# Patient Record
Sex: Female | Born: 1935 | Marital: Married | Smoking: Never smoker
Health system: Southern US, Community
[De-identification: ages and names within clinical notes are randomized; demographics above are authoritative.]

## PROBLEM LIST (undated history)

## (undated) DIAGNOSIS — I480 Paroxysmal atrial fibrillation: Secondary | ICD-10-CM

## (undated) DIAGNOSIS — E785 Hyperlipidemia, unspecified: Secondary | ICD-10-CM

## (undated) DIAGNOSIS — N186 End stage renal disease: Secondary | ICD-10-CM

## (undated) DIAGNOSIS — E1169 Type 2 diabetes mellitus with other specified complication: Secondary | ICD-10-CM

## (undated) DIAGNOSIS — E1121 Type 2 diabetes mellitus with diabetic nephropathy: Secondary | ICD-10-CM

## (undated) DIAGNOSIS — I509 Heart failure, unspecified: Secondary | ICD-10-CM

## (undated) DIAGNOSIS — I10 Essential (primary) hypertension: Secondary | ICD-10-CM

## (undated) DIAGNOSIS — I2510 Atherosclerotic heart disease of native coronary artery without angina pectoris: Secondary | ICD-10-CM

## (undated) HISTORY — PX: CORONARY ARTERY BYPASS GRAFT: SHX141

---

## 2000-10-24 HISTORY — PX: PACEMAKER PLACEMENT: SHX43

## 2006-12-21 ENCOUNTER — Emergency Department: Payer: Self-pay | Admitting: Emergency Medicine

## 2007-02-14 ENCOUNTER — Ambulatory Visit: Payer: Self-pay | Admitting: Ophthalmology

## 2007-02-21 ENCOUNTER — Ambulatory Visit: Payer: Self-pay | Admitting: Ophthalmology

## 2008-04-23 ENCOUNTER — Ambulatory Visit: Payer: Self-pay

## 2011-10-05 ENCOUNTER — Ambulatory Visit: Payer: Self-pay | Admitting: Ophthalmology

## 2012-08-01 ENCOUNTER — Ambulatory Visit: Payer: Self-pay | Admitting: Ophthalmology

## 2012-08-01 DIAGNOSIS — I10 Essential (primary) hypertension: Secondary | ICD-10-CM

## 2012-08-01 LAB — HEMOGLOBIN: HGB: 10.5 g/dL — ABNORMAL LOW (ref 12.0–16.0)

## 2012-08-08 ENCOUNTER — Ambulatory Visit: Payer: Self-pay | Admitting: Ophthalmology

## 2013-04-17 ENCOUNTER — Emergency Department: Payer: Self-pay | Admitting: Internal Medicine

## 2014-04-15 ENCOUNTER — Emergency Department: Payer: Self-pay | Admitting: Emergency Medicine

## 2015-01-19 IMAGING — CR RIGHT FOOT COMPLETE - 3+ VIEW
1 series · 3 of 3 positions shown · non-contrast
Comparison: 04/17/2013

CLINICAL DATA: Lateral right foot pain after a fall.

EXAM:
RIGHT FOOT COMPLETE - 3+ VIEW

[Series 1: x foot ap right · 0.14mm/px · 3 of 3 slices shown]
[im 1/3]
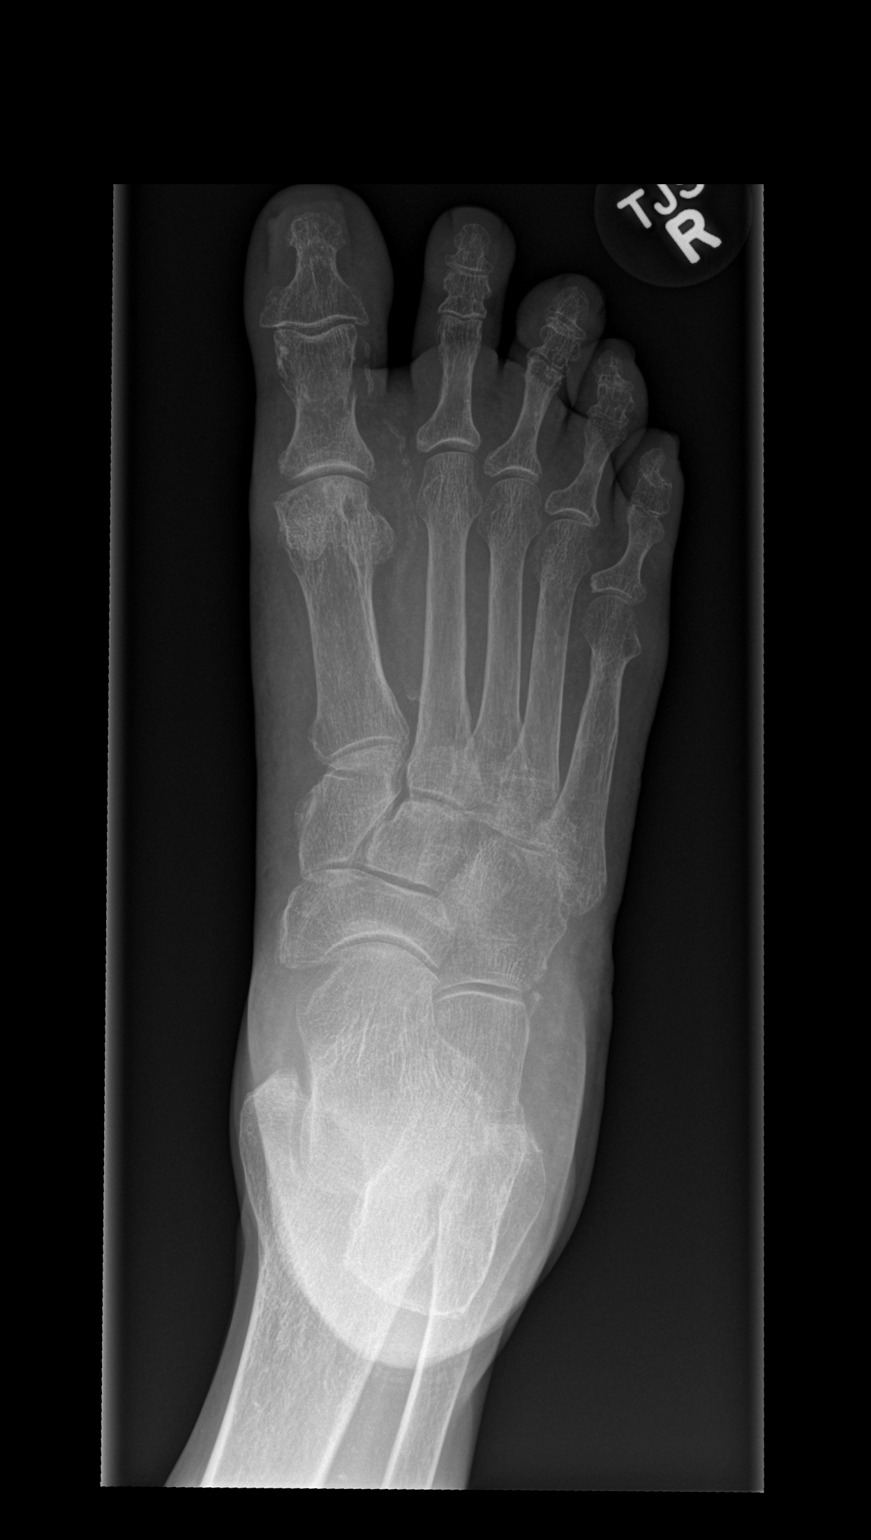
[im 2/3]
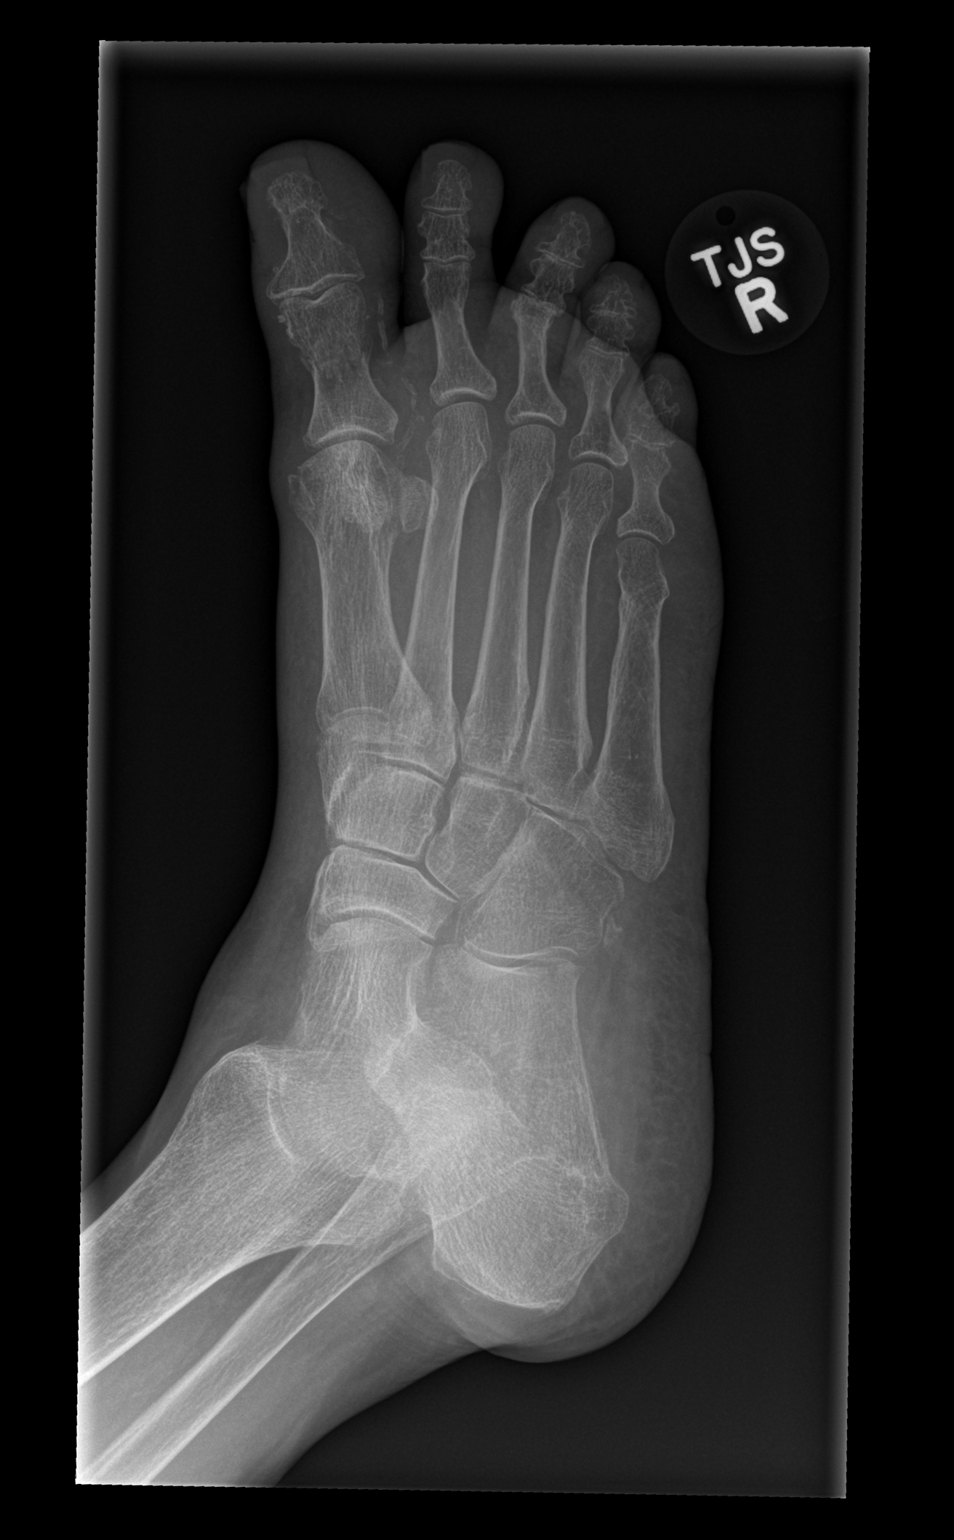
[im 3/3]
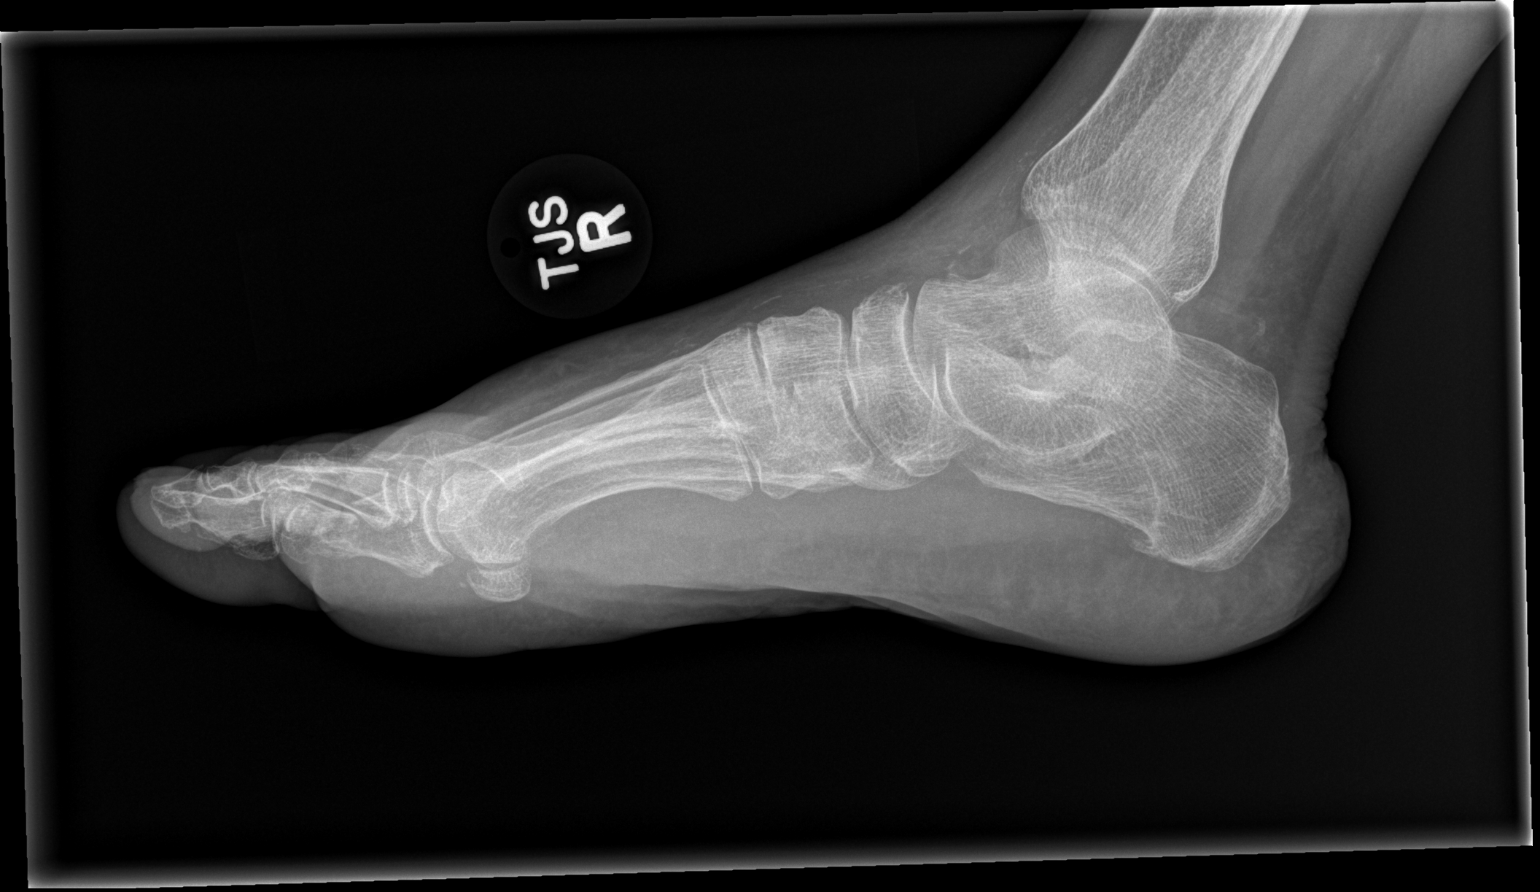

[3 of 3 positions shown; findings below may reference images not displayed]

FINDINGS: Diffuse bone demineralization. Mild degenerative changes in the
first metatarsal-phalangeal joint. There appears to be a transverse
fracture of the proximal shaft of the right first toe. No
significant displacement. No additional fractures are identified.
Old ununited ossicle over the cuboidal bone. Vascular
calcifications. Small plantar calcaneal spur.
IMPRESSION: Transverse nondisplaced fracture of the proximal phalanx of the
right first toe.

## 2015-02-10 NOTE — Op Note (Signed)
PATIENT NAME:  Jacqueline Ramos, Jacqueline Ramos MR#:  [id number] DATE OF BIRTH:  15-Feb-1935  DATE OF PROCEDURE:  08/08/2012  PREOPERATIVE DIAGNOSIS: Dense vitreous hemorrhage.   POSTOPERATIVE DIAGNOSES:  1. Dense vitreous hemorrhage.  2. Proliferative retinopathy.  3. Branch retinal vein occlusion.  PROCEDURES PERFORMED:  1. Pars plana vitrectomy of the right eye.  2. Endolaser of the right eye.   SURGEON: Garlan Fair, MD  ANESTHESIA: Retrobulbar block of the right eye with monitored anesthesia care.   COMPLICATIONS: None.   INDICATIONS FOR PROCEDURE: This patient presented to my office with significant loss of vision multiple weeks ago. Examination revealed a dense vitreous hemorrhage with no view of the back. Ultrasound noted a possible retinal detachment versus dense vitreous hemorrhage along the face of the vitreous. The risks, benefits, and alternatives of the above procedure were discussed and the patient wished to proceed.   DETAILS OF PROCEDURE: After informed consent was obtained, the patient was brought into the operative suite at [facility]. The patient was placed in the supine position and was given a small dose of Alfenta and a retrobulbar block was performed on the right eye by the primary surgeon without any complications. The right eye was prepped and draped in sterile manner. After lid speculum was inserted, a 23-gauge trocar was placed inferotemporally through displaced conjunctiva in an oblique fashion 3 mm beyond the limbus. Infusion cannula was turned on and inserted through the trocar and secured into position with Steri-Strips. Two more trocars were placed in a similar fashion superotemporally and superonasally. The vitreous cutter and light pipe were introduced in the eye and a core vitrectomy was performed. Care was taken to slowly remove the vitreous from anterior to posterior in order to avoid any possible incarceration or removal of retina. Once the  vitreous was clear and the retina could be visualized, it was noted that the retina was attached. Peripheral vitreous was trimmed for 360 degrees. Scleral depressed exam was performed in an area of neovascularization and the periphery was noted at approximately 8 o'clock. There appeared to be some venous occlusion associated with it. Scleral depressed exam was carried for 360 degrees and no signs of any breaks or tears could be identified. Peripheral vitreous was trimmed under scleral depression in order to remove as much blood as safely possible. Endolaser was introduced and panretinal photocoagulation was carried from the temporal arcade at approximately 6 o'clock around to 10:30 to 11 o'clock. Once this was completed, partial air-fluid exchange was performed and that area of laser was extended out to the ora serrata. The trocars were removed and the sclerotomies were closed using 6-0 plain gut. The wounds were noted to be airtight and pressure in the eye was confirmed to be approximately 15 mmHg. 5 mg of dexamethasone was given into the inferior fornix and the lid speculum was removed. The eye was cleaned and TobraDex was placed on the eye. A patch and shield were placed over the eye and the patient was taken to postanesthesia care with instructions to remain head up.  ____________________________ Teresa Pelton. Starling Manns, MD mfa:slb D: 08/08/2012 10:15:47 ET T: 08/08/2012 10:36:41 ET JOB#: [id number]  cc: Teresa Pelton. Starling Manns, MD, <Dictator> Coralee Rud MD ELECTRONICALLY SIGNED 08/21/2012 17:44

## 2018-11-03 ENCOUNTER — Other Ambulatory Visit: Payer: Self-pay

## 2018-11-03 ENCOUNTER — Emergency Department

## 2018-11-03 ENCOUNTER — Encounter: Payer: Self-pay | Admitting: Emergency Medicine

## 2018-11-03 ENCOUNTER — Inpatient Hospital Stay
Admit: 2018-11-03 | Discharge: 2018-11-03 | Disposition: A | Discharge status: Home / Self Care | Attending: Pulmonary Disease | Admitting: Pulmonary Disease

## 2018-11-03 ENCOUNTER — Inpatient Hospital Stay
Admission: EM | Admit: 2018-11-03 | Discharge: 2018-11-05 | DRG: 291 | Disposition: A | Discharge status: Home Health | Attending: Internal Medicine | Admitting: Internal Medicine

## 2018-11-03 DIAGNOSIS — Z7982 Long term (current) use of aspirin: Secondary | ICD-10-CM | POA: Diagnosis not present

## 2018-11-03 DIAGNOSIS — N186 End stage renal disease: Secondary | ICD-10-CM | POA: Diagnosis present

## 2018-11-03 DIAGNOSIS — I255 Ischemic cardiomyopathy: Secondary | ICD-10-CM | POA: Diagnosis present

## 2018-11-03 DIAGNOSIS — I2510 Atherosclerotic heart disease of native coronary artery without angina pectoris: Secondary | ICD-10-CM | POA: Diagnosis present

## 2018-11-03 DIAGNOSIS — J9601 Acute respiratory failure with hypoxia: Secondary | ICD-10-CM | POA: Diagnosis present

## 2018-11-03 DIAGNOSIS — Z79899 Other long term (current) drug therapy: Secondary | ICD-10-CM

## 2018-11-03 DIAGNOSIS — E785 Hyperlipidemia, unspecified: Secondary | ICD-10-CM | POA: Diagnosis present

## 2018-11-03 DIAGNOSIS — I480 Paroxysmal atrial fibrillation: Secondary | ICD-10-CM | POA: Diagnosis present

## 2018-11-03 DIAGNOSIS — Z794 Long term (current) use of insulin: Secondary | ICD-10-CM | POA: Diagnosis not present

## 2018-11-03 DIAGNOSIS — Z951 Presence of aortocoronary bypass graft: Secondary | ICD-10-CM

## 2018-11-03 DIAGNOSIS — Z79890 Hormone replacement therapy (postmenopausal): Secondary | ICD-10-CM

## 2018-11-03 DIAGNOSIS — Z9115 Patient's noncompliance with renal dialysis: Secondary | ICD-10-CM

## 2018-11-03 DIAGNOSIS — E1122 Type 2 diabetes mellitus with diabetic chronic kidney disease: Secondary | ICD-10-CM | POA: Diagnosis present

## 2018-11-03 DIAGNOSIS — Z950 Presence of cardiac pacemaker: Secondary | ICD-10-CM

## 2018-11-03 DIAGNOSIS — E875 Hyperkalemia: Secondary | ICD-10-CM | POA: Diagnosis present

## 2018-11-03 DIAGNOSIS — N184 Chronic kidney disease, stage 4 (severe): Secondary | ICD-10-CM

## 2018-11-03 DIAGNOSIS — I161 Hypertensive emergency: Secondary | ICD-10-CM | POA: Diagnosis present

## 2018-11-03 DIAGNOSIS — E872 Acidosis: Secondary | ICD-10-CM | POA: Diagnosis present

## 2018-11-03 DIAGNOSIS — G9340 Encephalopathy, unspecified: Secondary | ICD-10-CM

## 2018-11-03 DIAGNOSIS — I5023 Acute on chronic systolic (congestive) heart failure: Secondary | ICD-10-CM

## 2018-11-03 DIAGNOSIS — I132 Hypertensive heart and chronic kidney disease with heart failure and with stage 5 chronic kidney disease, or end stage renal disease: Secondary | ICD-10-CM | POA: Diagnosis present

## 2018-11-03 DIAGNOSIS — Z7902 Long term (current) use of antithrombotics/antiplatelets: Secondary | ICD-10-CM

## 2018-11-03 DIAGNOSIS — G931 Anoxic brain damage, not elsewhere classified: Secondary | ICD-10-CM | POA: Diagnosis present

## 2018-11-03 DIAGNOSIS — E039 Hypothyroidism, unspecified: Secondary | ICD-10-CM | POA: Diagnosis present

## 2018-11-03 HISTORY — DX: Atherosclerotic heart disease of native coronary artery without angina pectoris: I25.10

## 2018-11-03 HISTORY — DX: Heart failure, unspecified: I50.9

## 2018-11-03 HISTORY — DX: End stage renal disease: N18.6

## 2018-11-03 HISTORY — DX: Paroxysmal atrial fibrillation: I48.0

## 2018-11-03 HISTORY — DX: Type 2 diabetes mellitus with other specified complication: E11.69

## 2018-11-03 HISTORY — DX: Type 2 diabetes mellitus with diabetic nephropathy: E11.21

## 2018-11-03 HISTORY — DX: Essential (primary) hypertension: I10

## 2018-11-03 HISTORY — DX: Hyperlipidemia, unspecified: E78.5

## 2018-11-03 LAB — GLUCOSE, CAPILLARY
Glucose-Capillary: 190 mg/dL — ABNORMAL HIGH (ref 70–99)
Glucose-Capillary: 212 mg/dL — ABNORMAL HIGH (ref 70–99)
Glucose-Capillary: 63 mg/dL — ABNORMAL LOW (ref 70–99)
Glucose-Capillary: 128 mg/dL — ABNORMAL HIGH (ref 70–99)
Glucose-Capillary: 106 mg/dL — ABNORMAL HIGH (ref 70–99)
Glucose-Capillary: 118 mg/dL — ABNORMAL HIGH (ref 70–99)

## 2018-11-03 LAB — COMPREHENSIVE METABOLIC PANEL
ALBUMIN: 3.5 g/dL (ref 3.5–5.0)
ALT: 33 U/L (ref 0–44)
AST: 53 U/L — ABNORMAL HIGH (ref 15–41)
Alkaline Phosphatase: 123 U/L (ref 38–126)
Anion gap: 10 (ref 5–15)
BUN: 68 mg/dL — ABNORMAL HIGH (ref 8–23)
CO2: 16 mmol/L — ABNORMAL LOW (ref 22–32)
Calcium: 8.8 mg/dL — ABNORMAL LOW (ref 8.9–10.3)
Chloride: 114 mmol/L — ABNORMAL HIGH (ref 98–111)
Creatinine, Ser: 4.91 mg/dL — ABNORMAL HIGH (ref 0.44–1.00)
GFR calc Af Amer: 9 mL/min — ABNORMAL LOW (ref >60)
GFR calc non Af Amer: 8 mL/min — ABNORMAL LOW (ref >60)
GLUCOSE: 250 mg/dL — AB (ref 70–99)
POTASSIUM: 4.6 mmol/L (ref 3.5–5.1)
Sodium: 140 mmol/L (ref 135–145)
Total Bilirubin: 0.5 mg/dL (ref 0.3–1.2)
Total Protein: 8.3 g/dL — ABNORMAL HIGH (ref 6.5–8.1)

## 2018-11-03 LAB — CBC WITH DIFFERENTIAL/PLATELET
Abs Immature Granulocytes: 0.05 10*3/uL (ref 0.00–0.07)
BASOS ABS: 0 10*3/uL (ref 0.0–0.1)
Basophils Relative: 0 %
Eosinophils Absolute: 0.2 10*3/uL (ref 0.0–0.5)
Eosinophils Relative: 2 %
HCT: 32.6 % — ABNORMAL LOW (ref 36.0–46.0)
Hemoglobin: 10.1 g/dL — ABNORMAL LOW (ref 12.0–15.0)
Immature Granulocytes: 1 %
Lymphocytes Relative: 22 %
Lymphs Abs: 2.3 10*3/uL (ref 0.7–4.0)
MCH: 23.1 pg — ABNORMAL LOW (ref 26.0–34.0)
MCHC: 31 g/dL (ref 30.0–36.0)
MCV: 74.4 fL — ABNORMAL LOW (ref 80.0–100.0)
Monocytes Absolute: 0.7 10*3/uL (ref 0.1–1.0)
Monocytes Relative: 7 %
NRBC: 0 % (ref 0.0–0.2)
Neutro Abs: 6.9 10*3/uL (ref 1.7–7.7)
Neutrophils Relative %: 68 %
Platelets: 204 10*3/uL (ref 150–400)
RBC: 4.38 MIL/uL (ref 3.87–5.11)
RDW: 15.9 % — ABNORMAL HIGH (ref 11.5–15.5)
WBC: 10.1 10*3/uL (ref 4.0–10.5)

## 2018-11-03 LAB — POCT I-STAT, CHEM 8
BUN: 76 mg/dL — ABNORMAL HIGH (ref 8–23)
Calcium, Ion: 1.19 mmol/L (ref 1.15–1.40)
Chloride: 115 mmol/L — ABNORMAL HIGH (ref 98–111)
Creatinine, Ser: 5.5 mg/dL — ABNORMAL HIGH (ref 0.44–1.00)
GLUCOSE: 254 mg/dL — AB (ref 70–99)
HCT: 35 % — ABNORMAL LOW (ref 36.0–46.0)
Hemoglobin: 11.9 g/dL — ABNORMAL LOW (ref 12.0–15.0)
Potassium: 5.3 mmol/L — ABNORMAL HIGH (ref 3.5–5.1)
Sodium: 142 mmol/L (ref 135–145)
TCO2: 20 mmol/L — ABNORMAL LOW (ref 22–32)

## 2018-11-03 LAB — BLOOD GAS, ARTERIAL
Acid-base deficit: 8.4 mmol/L — ABNORMAL HIGH (ref 0.0–2.0)
Bicarbonate: 17.2 mmol/L — ABNORMAL LOW (ref 20.0–28.0)
FIO2: 100
MECHVT: 450 mL
Mechanical Rate: 15
O2 SAT: 99.4 %
PCO2 ART: 35 mmHg (ref 32.0–48.0)
PEEP: 5 cmH2O
PO2 ART: 168 mmHg — AB (ref 83.0–108.0)
Patient temperature: 37
pH, Arterial: 7.3 — ABNORMAL LOW (ref 7.350–7.450)

## 2018-11-03 LAB — ECHOCARDIOGRAM COMPLETE
Height: 54 in
Weight: 1777.79 oz

## 2018-11-03 LAB — BLOOD GAS, VENOUS
Acid-base deficit: 10.1 mmol/L — ABNORMAL HIGH (ref 0.0–2.0)
Bicarbonate: 17.2 mmol/L — ABNORMAL LOW (ref 20.0–28.0)
Delivery systems: POSITIVE
FIO2: 100
Mechanical Rate: 8
O2 Saturation: 80.1 %
PCO2 VEN: 42 mmHg — AB (ref 44.0–60.0)
Patient temperature: 37
pH, Ven: 7.22 — ABNORMAL LOW (ref 7.250–7.430)
pO2, Ven: 54 mmHg — ABNORMAL HIGH (ref 32.0–45.0)

## 2018-11-03 LAB — INFLUENZA PANEL BY PCR (TYPE A & B)
INFLBPCR: NEGATIVE
Influenza A By PCR: NEGATIVE

## 2018-11-03 LAB — TSH: TSH: 7.849 u[IU]/mL — ABNORMAL HIGH (ref 0.350–4.500)

## 2018-11-03 LAB — SALICYLATE LEVEL: Salicylate Lvl: <7 mg/dL (ref 2.8–30.0)

## 2018-11-03 LAB — BRAIN NATRIURETIC PEPTIDE: B Natriuretic Peptide: 2382 pg/mL — ABNORMAL HIGH (ref 0.0–100.0)

## 2018-11-03 LAB — TROPONIN I: Troponin I: <0.03 ng/mL (ref <0.03)

## 2018-11-03 LAB — MRSA PCR SCREENING: MRSA by PCR: NEGATIVE

## 2018-11-03 MED ORDER — FUROSEMIDE 10 MG/ML IJ SOLN: 120.0000 mg | Freq: Once | INTRAVENOUS | Status: DC

## 2018-11-03 MED ORDER — ONDANSETRON HCL 4 MG/2ML IJ SOLN
4.0000 mg | Freq: Four times a day (QID) | INTRAMUSCULAR | Status: DC | PRN
  Filled 2018-11-03: qty 2

## 2018-11-03 MED ORDER — ROCURONIUM BROMIDE 50 MG/5ML IV SOLN
60.0000 mg | Freq: Once | INTRAVENOUS | Status: AC
  Administered 2018-11-03: 60 mg via INTRAVENOUS

## 2018-11-03 MED ORDER — LEVOTHYROXINE SODIUM 112 MCG PO TABS
112.0000 ug | ORAL_TABLET | Freq: Every day | ORAL | Status: DC
  Administered 2018-11-03 – 2018-11-05 (×3): 112 ug
  Filled 2018-11-03 (×3): qty 1

## 2018-11-03 MED ORDER — ONDANSETRON HCL 4 MG PO TABS: 4.0000 mg | ORAL_TABLET | Freq: Four times a day (QID) | ORAL | Status: DC | PRN

## 2018-11-03 MED ORDER — DEXTROSE 50 % IV SOLN: 25.0000 g | Freq: Once | INTRAVENOUS | Status: AC

## 2018-11-03 MED ORDER — ACETAMINOPHEN 650 MG RE SUPP
650.0000 mg | Freq: Four times a day (QID) | RECTAL | Status: DC | PRN
  Administered 2018-11-04: 650 mg via RECTAL
  Filled 2018-11-03: qty 1

## 2018-11-03 MED ORDER — KETAMINE HCL 10 MG/ML IJ SOLN
100.0000 mg | Freq: Once | INTRAMUSCULAR | Status: AC
  Administered 2018-11-03: 100 mg via INTRAVENOUS

## 2018-11-03 MED ORDER — DOCUSATE SODIUM 100 MG PO CAPS
100.0000 mg | ORAL_CAPSULE | Freq: Two times a day (BID) | ORAL | Status: DC
  Administered 2018-11-04 – 2018-11-05 (×3): 100 mg via ORAL
  Filled 2018-11-03 (×3): qty 1

## 2018-11-03 MED ORDER — DEXTROSE 50 % IV SOLN
INTRAVENOUS | Status: AC
  Administered 2018-11-03: 50 mL
  Filled 2018-11-03: qty 50

## 2018-11-03 MED ORDER — INSULIN ASPART 100 UNIT/ML ~~LOC~~ SOLN
0.0000 [IU] | SUBCUTANEOUS | Status: DC
  Administered 2018-11-03: 3 [IU] via SUBCUTANEOUS
  Administered 2018-11-03: 2 [IU] via SUBCUTANEOUS
  Administered 2018-11-04: 1 [IU] via SUBCUTANEOUS
  Filled 2018-11-03 (×3): qty 1

## 2018-11-03 MED ORDER — IPRATROPIUM-ALBUTEROL 0.5-2.5 (3) MG/3ML IN SOLN
3.0000 mL | Freq: Four times a day (QID) | RESPIRATORY_TRACT | Status: DC
  Administered 2018-11-03: 3 mL via RESPIRATORY_TRACT
  Filled 2018-11-03 (×2): qty 3

## 2018-11-03 MED ORDER — HEPARIN SODIUM (PORCINE) 5000 UNIT/ML IJ SOLN
5000.0000 [IU] | Freq: Three times a day (TID) | INTRAMUSCULAR | Status: DC
  Administered 2018-11-03 – 2018-11-04 (×6): 5000 [IU] via SUBCUTANEOUS
  Filled 2018-11-03 (×7): qty 1

## 2018-11-03 MED ORDER — FENTANYL 2500MCG IN NS 250ML (10MCG/ML) PREMIX INFUSION
100.0000 ug/h | INTRAVENOUS | Status: DC
  Administered 2018-11-03: 100 ug/h via INTRAVENOUS
  Filled 2018-11-03 (×2): qty 250

## 2018-11-03 MED ORDER — ACETAMINOPHEN 325 MG PO TABS: 650.0000 mg | ORAL_TABLET | Freq: Four times a day (QID) | ORAL | Status: DC | PRN

## 2018-11-03 MED ORDER — FUROSEMIDE 10 MG/ML IJ SOLN
80.0000 mg | Freq: Once | INTRAMUSCULAR | Status: AC
  Administered 2018-11-03: 80 mg via INTRAVENOUS
  Filled 2018-11-03: qty 8

## 2018-11-03 MED ORDER — FUROSEMIDE 10 MG/ML IJ SOLN
40.0000 mg | Freq: Once | INTRAMUSCULAR | Status: AC
  Administered 2018-11-03: 40 mg via INTRAVENOUS
  Filled 2018-11-03: qty 4

## 2018-11-03 MED ORDER — FENTANYL CITRATE (PF) 100 MCG/2ML IJ SOLN
150.0000 ug | Freq: Once | INTRAMUSCULAR | Status: AC
  Administered 2018-11-03: 150 ug via INTRAVENOUS
  Filled 2018-11-03: qty 4

## 2018-11-03 MED ORDER — PROPOFOL 1000 MG/100ML IV EMUL
5.0000 ug/kg/min | INTRAVENOUS | Status: DC
  Administered 2018-11-03: 20 ug/kg/min via INTRAVENOUS
  Administered 2018-11-03: 50 ug/kg/min via INTRAVENOUS
  Filled 2018-11-03 (×2): qty 100

## 2018-11-03 MED ORDER — INSULIN ASPART 100 UNIT/ML ~~LOC~~ SOLN: 0.0000 [IU] | Freq: Every day | SUBCUTANEOUS | Status: DC

## 2018-11-03 MED ORDER — PANTOPRAZOLE SODIUM 40 MG IV SOLR
40.0000 mg | INTRAVENOUS | Status: DC
  Administered 2018-11-03 – 2018-11-04 (×2): 40 mg via INTRAVENOUS
  Filled 2018-11-03 (×2): qty 40

## 2018-11-03 NOTE — ED Provider Notes (Signed)
[facility] Emergency Department Provider Note  ____________________________________________   First MD Initiated Contact with Patient 11/03/18 0036     (approximate)  I have reviewed the triage vital signs and the nursing notes.   HISTORY  Chief Complaint Respiratory Distress  Level 5 exemption history is limited by the patient's critical illness  HPI Jacqueline Ramos is a 84 y.o. female comes to the emergency department via EMS with respiratory distress.  The patient is unable to provide much meaningful history secondary to severe respiratory distress.  Apparently her shortness of breath began acutely at 9 PM roughly 3-1/2 hours prior to arrival.  When EMS arrived they noted that she was saturating around 80% on room air although did come up with a facemask.  She has a known history of congestive heart failure, diabetes, hypertension, and chronic kidney disease although she does not require dialysis.    Past Medical History:  Diagnosis Date  . CHF (congestive heart failure) ([place])   . Diabetes mellitus without complication ([place])   . Hypertension   . Renal disorder     Patient Active Problem List   Diagnosis Date Noted  . Acute respiratory failure with hypoxemia ([place]) 11/03/2018    Past Surgical History:  Procedure Laterality Date  . CORONARY ARTERY BYPASS GRAFT    . PACEMAKER PLACEMENT  2002    Prior to Admission medications   Medication Sig Start Date End Date Taking? Authorizing Provider  amLODipine (NORVASC) 10 MG tablet Take 10 mg by mouth daily.   Yes [provider]  carvedilol (COREG) 12.5 MG tablet Take 25 mg by mouth 2 (two) times daily with a meal.   Yes [provider]  hydrALAZINE (APRESOLINE) 50 MG tablet Take 50 mg by mouth 3 (three) times daily.   Yes [provider]  insulin aspart (NOVOLOG) 100 UNIT/ML injection Inject 8 Units into the skin 3 (three) times daily before meals.   Yes [provider]  isosorbide mononitrate (IMDUR) 60 MG 24 hr tablet Take 60 mg by mouth daily.   Yes [provider]  levothyroxine (SYNTHROID, LEVOTHROID) 112 MCG tablet Take 112 mcg by mouth daily before breakfast.   Yes [provider]  lovastatin (MEVACOR) 40 MG tablet Take 40 mg by mouth at bedtime.   Yes [provider]  sevelamer carbonate (RENVELA) 800 MG tablet Take 800 mg by mouth 3 (three) times daily with meals.   Yes [provider]  torsemide (DEMADEX) 10 MG tablet Take 10 mg by mouth daily. May take an extra tablet if short of breath   Yes [provider]  vitamin B-12 (CYANOCOBALAMIN) 1000 MCG tablet Take 1,000 mcg by mouth daily.   Yes [provider]  nitroGLYCERIN (NITROSTAT) 0.4 MG SL tablet Place 0.4 mg under the tongue every 5 (five) minutes as needed for chest pain.    [provider]    Allergies Patient has no known allergies.  Family History  Problem Relation Age of Onset  . Obesity Daughter     Social History Social History   Tobacco Use  . Smoking status: Never Smoker  . Smokeless tobacco: Never Used  Substance Use Topics  . Alcohol use: Never    Frequency: Never  . Drug use: Never    Review of Systems Level 5 exemption history limited by the patient's clinical condition and respiratory distress  ____________________________________________   PHYSICAL EXAM:  VITAL SIGNS: ED Triage Vitals  Enc Vitals Group  BP --      Pulse --      Resp --      Temp --      Temp src --      SpO2 --      Weight 11/03/18 0035 110 lb 10.7 oz (50.2 kg)     Height 11/03/18 0035 4' 6"  (1.372 m)     Head Circumference --      Peak Flow --      Pain Score 11/03/18 0033 0     Pain Loc --      Pain Edu? --      Excl. in [place]? --     Constitutional: Appears critically ill with elevated respiratory rate speaking in short gasps and quite confused Eyes: PERRL EOMI. midrange and brisk Head: Atraumatic. Nose: No  congestion/rhinnorhea. Mouth/Throat: No trismus Neck: No stridor.  Unable to lie completely flat with a very large JVD Cardiovascular: Tachycardic rate, regular rhythm. Grossly normal heart sounds.  Good peripheral circulation. Respiratory: Severe respiratory distress with rales bilaterally through the majority of her lung fields.  Moving limited amounts of air Gastrointestinal: Soft nontender Musculoskeletal: Legs are equal in size with pitting Neurologic: Moves all 4 and feels all 4 Skin: Dripping sweat Psychiatric: Encephalopathic  ____________________________________________   DIFFERENTIAL includes but not limited to  Flash pulmonary edema, pneumothorax, reactive airway disease, pulmonary embolism, pneumonia ____________________________________________   LABS (all labs ordered are listed, but only abnormal results are displayed)  Labs Reviewed  BLOOD GAS, VENOUS - Abnormal; Notable for the following components:      Result Value   pH, Ven 7.22 (*)    pCO2, Ven 42 (*)    pO2, Ven 54.0 (*)    Bicarbonate 17.2 (*)    Acid-base deficit 10.1 (*)    All other components within normal limits  COMPREHENSIVE METABOLIC PANEL - Abnormal; Notable for the following components:   Chloride 114 (*)    CO2 16 (*)    Glucose, Bld 250 (*)    BUN 68 (*)    Creatinine, Ser 4.91 (*)    Calcium 8.8 (*)    Total Protein 8.3 (*)    AST 53 (*)    GFR calc non Af Amer 8 (*)    GFR calc Af Amer 9 (*)    All other components within normal limits  BRAIN NATRIURETIC PEPTIDE - Abnormal; Notable for the following components:   B Natriuretic Peptide 2,382.0 (*)    All other components within normal limits  CBC WITH DIFFERENTIAL/PLATELET - Abnormal; Notable for the following components:   Hemoglobin 10.1 (*)    HCT 32.6 (*)    MCV 74.4 (*)    MCH 23.1 (*)    RDW 15.9 (*)    All other components within normal limits  BLOOD GAS, ARTERIAL - Abnormal; Notable for the following components:   pH,  Arterial 7.30 (*)    pO2, Arterial 168 (*)    Bicarbonate 17.2 (*)    Acid-base deficit 8.4 (*)    All other components within normal limits  TSH - Abnormal; Notable for the following components:   TSH 7.849 (*)    All other components within normal limits  GLUCOSE, CAPILLARY - Abnormal; Notable for the following components:   Glucose-Capillary 190 (*)    All other components within normal limits  POCT I-STAT, CHEM 8 - Abnormal; Notable for the following components:   Potassium 5.3 (*)    Chloride 115 (*)  BUN 76 (*)    Creatinine, Ser 5.50 (*)    Glucose, Bld 254 (*)    TCO2 20 (*)    Hemoglobin 11.9 (*)    HCT 35.0 (*)    All other components within normal limits  MRSA PCR SCREENING  TROPONIN I  INFLUENZA PANEL BY PCR (TYPE A & B)  SALICYLATE LEVEL  I-STAT CHEM 8, ED    Lab work reviewed by me with a number of abnormalities.  Her thyroid functions are essentially normal.  Her first blood gas shows metabolic acidosis.  She is influenza negative.  Elevated BNP consistent with acute pulmonary edema __________________________________________  EKG  ED ECG REPORT I, Darel Hong, the attending physician, personally viewed and interpreted this ECG.  Date: 11/03/2018 EKG Time:  Rate: 78 Rhythm: normal sinus rhythm QRS Axis: Leftward axis Intervals: Prolonged QTC ST/T Wave abnormalities: Left bundle branch block with anticipated repolarization abnormalities Narrative Interpretation: no evidence of acute ischemia  ____________________________________________  RADIOLOGY  First chest x-ray reviewed by me with right-sided pleural effusion and significant pulmonary edema Second chest x-ray reviewed by me shows endotracheal tube and orogastric tubes in good position ____________________________________________   PROCEDURES  Procedure(s) performed: Yes  Angiocath insertion Performed by: Darel Hong  Consent: Verbal consent obtained. Risks and benefits: risks,  benefits and alternatives were discussed Time out: Immediately prior to procedure a "time out" was called to verify the correct patient, procedure, equipment, support staff and site/side marked as required.  Preparation: Patient was prepped and draped in the usual sterile fashion.  Vein Location: Left EJ  Gauge: 18  Normal blood return and flush without difficulty Patient tolerance: Patient tolerated the procedure well with no immediate complications.     .Critical Care Performed by: Darel Hong, MD Authorized by: Darel Hong, MD   Critical care provider statement:    Critical care time (minutes):  40   Critical care time was exclusive of:  Separately billable procedures and treating other patients   Critical care was necessary to treat or prevent imminent or life-threatening deterioration of the following conditions:  Respiratory failure   Critical care was time spent personally by me on the following activities:  Development of treatment plan with patient or surrogate, discussions with consultants, evaluation of patient's response to treatment, examination of patient, obtaining history from patient or surrogate, ordering and performing treatments and interventions, ordering and review of laboratory studies, ordering and review of radiographic studies, pulse oximetry, re-evaluation of patient's condition and review of old charts Procedure Name: Intubation Date/Time: 11/03/2018 2:44 AM Performed by: Darel Hong, MD Pre-anesthesia Checklist: Patient identified, Patient being monitored, Emergency Drugs available, Timeout performed and Suction available Oxygen Delivery Method: Nasal cannula Preoxygenation: Pre-oxygenation with 100% oxygen Induction Type: Rapid sequence Ventilation: Mask ventilation without difficulty Laryngoscope Size: Mac and 3 Grade View: Grade I Tube size: 7.5 mm Number of attempts: 1 Placement Confirmation: ETT inserted through vocal cords under direct  vision,  CO2 detector,  Breath sounds checked- equal and bilateral and Positive ETCO2 Secured at: 21 cm Tube secured with: ETT holder    OG placement Date/Time: 11/03/2018 2:44 AM Performed by: Darel Hong, MD Authorized by: Darel Hong, MD  Consent: The procedure was performed in an emergent situation. Patient tolerance: Patient tolerated the procedure well with no immediate complications     Critical Care performed: Yes  ____________________________________________   INITIAL IMPRESSION / ASSESSMENT AND PLAN / ED COURSE  Pertinent labs & imaging results that were available during my care  of the patient were reviewed by me and considered in my medical decision making (see chart for details).   As part of my medical decision making, I reviewed the following data within the [place] History obtained from family if available, nursing notes, old chart and ekg, as well as notes from prior ED visits.  The patient arrived in the emergency department critically ill-appearing in severe respiratory distress breathing more than 40 times a minute, diaphoretic, and quite confused.  Her legs are not particularly swollen although she is unable to lie flat with a very large JVD.  I am concerned about flash pulmonary edema and worsening renal failure versus heart failure.  We placed the patient on BiPAP initially and used rather aggressive settings with 15/5 and while this helped minimally she was still breathing heavily and remains lethargic and obtunded.  As altered mental status is a contraindication to BiPAP and the patient clearly needs positive pressure decision was made to intubate she was intubated without complication.  She is on propofol and fentanyl infusions for pain and sedation and I will give her 80 mg of IV Lasix to help begin aggressive diuresis.  We then discussed with the hospitalist Dr. Jannifer Franklin who has graciously agreed to admit the patient to his service.       ----------------------------------------- 2:44 AM on 11/03/2018 -----------------------------------------  The patient gets all of her care at [facility] and her family was at bedside and I updated them with her clinical condition and discussed the next steps.  They have requested a lateral transfer to [facility] if at all possible and I placed a call to [facility] however unfortunately they are at capacity and unable to accept any transfers.  The patient is admitted to our intensive care unit in improved but critical condition. ____________________________________________   FINAL CLINICAL IMPRESSION(S) / ED DIAGNOSES  Final diagnoses:  Acute respiratory failure with hypoxia (HCC)  Encephalopathy  Chronic kidney disease (CKD), stage IV (severe) (HCC)      NEW MEDICATIONS STARTED DURING THIS VISIT:  Current Discharge Medication List       Note:  This document was prepared using Dragon voice recognition software and may include unintentional dictation errors.    Darel Hong, MD 11/03/18 [phone]

## 2018-11-03 NOTE — ED Notes (Signed)
Family upset about pt being intubated without consent of family. This RN explained to pt's family that pt was having difficulty breathing. Family asked for MD Rifenbark to step in and speak with them. MD Rifenbark aware.

## 2018-11-03 NOTE — ED Triage Notes (Addendum)
Pt arrives via [facility] with c/o respiratory distress. Pt reports that after she finished eating around 2100 she experienced SOB. Per EMS, pt had VS 90% RA, BP 200/100, RR 30, and a right bundle branch block on their 12 lead. Pt is working at this time.

## 2018-11-03 NOTE — Progress Notes (Signed)
Patient successfully extubated to 2l [place] with no complications. Will continue to monitor.

## 2018-11-03 NOTE — Progress Notes (Addendum)
[facility] [place] Progress Note Patient Name: Jacqueline Ramos DOB: 03/19/1935 MRN: [id number]   Date of Service  11/03/2018  HPI/Events of Note  83/F with hx of CHF, brought in due to shortness of breath. Per ED, the patient was in respiratory distress and placed on BIPAP with only minimal improvement.  Pt was subsequently intubated.   Pt is intubate and sedated.  She is synchronous with the vent.   eICU Interventions  Diurese. Monitor I&Os.  Heparin for DVT prophylaxis.  PPI ordered. SBT in the morning.  Check 2d echo.      Intervention Category Evaluation Type: New Patient Evaluation  Elsie Lincoln 11/03/2018, 4:01 AM

## 2018-11-03 NOTE — ED Notes (Signed)
ED TO INPATIENT HANDOFF REPORT  Name/Age/Gender Jacqueline Ramos 84 y.o. female  Code Status   Home/SNF/Other Home  Chief Complaint Breathing difficulty  Level of Care/Admitting Diagnosis ED Disposition    ED Disposition Condition [place]: [place] [100120]  Level of Care: ICU [6]  Diagnosis: Acute respiratory failure with hypoxemia [facility]) [[id number]]  Admitting Physician: Harrie Foreman [[id number]]  Attending Physician: Harrie Foreman [[id number]]  Estimated length of stay: past midnight tomorrow  Certification:: I certify this patient will need inpatient services for at least 2 midnights  PT Class (Do Not Modify): Inpatient [101]  PT Acc Code (Do Not Modify): Private [1]       Medical History Past Medical History:  Diagnosis Date  . CHF (congestive heart failure) ([place])   . Diabetes mellitus without complication ([place])   . Hypertension   . Renal disorder     Allergies No Known Allergies  IV Location/Drains/Wounds Patient Lines/Drains/Airways Status   Active Line/Drains/Airways    Name:   Placement date:   Placement time:   Site:   Days:   Peripheral IV 11/03/18 Left Forearm   11/03/18    0045    Forearm   less than 1   Peripheral IV 11/03/18 Left External jugular   11/03/18    0054    External jugular   less than 1   Peripheral IV 11/03/18 Right Forearm   11/03/18    0115    Forearm   less than 1   NG/OG Tube Orogastric 16 Fr. Center mouth Xray   11/03/18    0115    Center mouth   less than 1   Urethral Catheter Madolyn Ackroyd RN Latex 14 Fr.   11/03/18    0127    Latex   less than 1   Airway 7.5 mm   11/03/18    0104     less than 1          Labs/Imaging Results for orders placed or performed during the hospital encounter of 11/03/18 (from the past 48 hour(s))  I-STAT, chem 8     Status: Abnormal   Collection Time: 11/03/18 12:38 AM  Result Value Ref Range   Sodium 142 135 - 145 mmol/L   Potassium 5.3 (H) 3.5 -  5.1 mmol/L   Chloride 115 (H) 98 - 111 mmol/L   BUN 76 (H) 8 - 23 mg/dL   Creatinine, Ser 5.50 (H) 0.44 - 1.00 mg/dL   Glucose, Bld 254 (H) 70 - 99 mg/dL   Calcium, Ion 1.19 1.15 - 1.40 mmol/L   TCO2 20 (L) 22 - 32 mmol/L   Hemoglobin 11.9 (L) 12.0 - 15.0 g/dL   HCT 35.0 (L) 36.0 - 46.0 %  Blood gas, venous     Status: Abnormal   Collection Time: 11/03/18 12:40 AM  Result Value Ref Range   FIO2 100.00    Delivery systems BILEVEL POSITIVE AIRWAY PRESSURE    pH, Ven 7.22 (L) 7.250 - 7.430    Comment: CRITICAL RESULT CALLED TO, READ BACK BY AND VERIFIED WITH: DR.RIFENBARK AT 0047 [id number] BY SMATHEW RRT    pCO2, Ven 42 (L) 44.0 - 60.0 mmHg   pO2, Ven 54.0 (H) 32.0 - 45.0 mmHg   Bicarbonate 17.2 (L) 20.0 - 28.0 mmol/L   Acid-base deficit 10.1 (H) 0.0 - 2.0 mmol/L   O2 Saturation 80.1 %   Patient temperature 37.0    Collection site VENOUS  Sample type VENOUS    Mechanical Rate 8     Comment: Performed at [facility], [address] [place], [place] [address]  Comprehensive metabolic panel     Status: Abnormal   Collection Time: 11/03/18 12:40 AM  Result Value Ref Range   Sodium 140 135 - 145 mmol/L   Potassium 4.6 3.5 - 5.1 mmol/L   Chloride 114 (H) 98 - 111 mmol/L   CO2 16 (L) 22 - 32 mmol/L   Glucose, Bld 250 (H) 70 - 99 mg/dL   BUN 68 (H) 8 - 23 mg/dL   Creatinine, Ser 4.91 (H) 0.44 - 1.00 mg/dL   Calcium 8.8 (L) 8.9 - 10.3 mg/dL   Total Protein 8.3 (H) 6.5 - 8.1 g/dL   Albumin 3.5 3.5 - 5.0 g/dL   AST 53 (H) 15 - 41 U/L   ALT 33 0 - 44 U/L   Alkaline Phosphatase 123 38 - 126 U/L   Total Bilirubin 0.5 0.3 - 1.2 mg/dL   GFR calc non Af Amer 8 (L) >60 mL/min   GFR calc Af Amer 9 (L) >60 mL/min   Anion gap 10 5 - 15    Comment: Performed at [facility], [address] [place], [place] [address]  Brain natriuretic peptide     Status: Abnormal   Collection Time: 11/03/18 12:40 AM  Result Value Ref Range   B Natriuretic Peptide 2,382.0 (H) 0.0 -  100.0 pg/mL    Comment: Performed at [facility], [address] [place], [place] [address]  Troponin I - Once     Status: None   Collection Time: 11/03/18 12:40 AM  Result Value Ref Range   Troponin I <0.03 <0.03 ng/mL    Comment: Performed at [facility], [address] [place], [place] [address]  CBC with Differential     Status: Abnormal   Collection Time: 11/03/18 12:40 AM  Result Value Ref Range   WBC 10.1 4.0 - 10.5 K/uL   RBC 4.38 3.87 - 5.11 MIL/uL   Hemoglobin 10.1 (L) 12.0 - 15.0 g/dL   HCT 32.6 (L) 36.0 - 46.0 %   MCV 74.4 (L) 80.0 - 100.0 fL   MCH 23.1 (L) 26.0 - 34.0 pg   MCHC 31.0 30.0 - 36.0 g/dL   RDW 15.9 (H) 11.5 - 15.5 %   Platelets 204 150 - 400 K/uL   nRBC 0.0 0.0 - 0.2 %   Neutrophils Relative % 68 %   Neutro Abs 6.9 1.7 - 7.7 K/uL   Lymphocytes Relative 22 %   Lymphs Abs 2.3 0.7 - 4.0 K/uL   Monocytes Relative 7 %   Monocytes Absolute 0.7 0.1 - 1.0 K/uL   Eosinophils Relative 2 %   Eosinophils Absolute 0.2 0.0 - 0.5 K/uL   Basophils Relative 0 %   Basophils Absolute 0.0 0.0 - 0.1 K/uL   Immature Granulocytes 1 %   Abs Immature Granulocytes 0.05 0.00 - 0.07 K/uL    Comment: Performed at [facility], [address] [place], [place] [address]  Influenza panel by PCR (type A & B)     Status: None   Collection Time: 11/03/18 12:40 AM  Result Value Ref Range   Influenza A By PCR NEGATIVE NEGATIVE   Influenza B By PCR NEGATIVE NEGATIVE    Comment: (NOTE) The Xpert Xpress Flu assay is intended as an aid in the diagnosis of  influenza and should not be used as a  sole basis for treatment.  This  assay is FDA approved for nasopharyngeal swab specimens only. Nasal  washings and aspirates are unacceptable for Xpert Xpress Flu testing. Performed at [facility], [address] [place], [place] [address]   Salicylate level     Status: None   Collection Time: 11/03/18  1:44 AM  Result Value Ref Range   Salicylate  Lvl <7.4 2.8 - 30.0 mg/dL    Comment: Performed at [facility], [address] [place], [place] [address]  Blood gas, arterial     Status: Abnormal   Collection Time: 11/03/18  2:24 AM  Result Value Ref Range   FIO2 100.00    Delivery systems VENTILATOR    Mode ASSIST CONTROL    VT 450 mL   Peep/cpap 5.0 cm H20   pH, Arterial 7.30 (L) 7.350 - 7.450   pCO2 arterial 35 32.0 - 48.0 mmHg   pO2, Arterial 168 (H) 83.0 - 108.0 mmHg   Bicarbonate 17.2 (L) 20.0 - 28.0 mmol/L   Acid-base deficit 8.4 (H) 0.0 - 2.0 mmol/L   O2 Saturation 99.4 %   Patient temperature 37.0    Collection site LEFT RADIAL    Sample type ARTERIAL DRAW    Allens test (pass/fail) PASS PASS   Mechanical Rate 15     Comment: Performed at [facility], [address] [place], [place] [address]   Dg Chest Port 1 View  Result Date: 11/03/2018 CLINICAL DATA:  Intubation EXAM: PORTABLE CHEST 1 VIEW COMPARISON:  11/03/2018 FINDINGS: An endotracheal tube is been placed with tip measuring 2.2 cm above the carina. Enteric tube placed, partially off the field of view but tip is in the left upper quadrant consistent with location in the upper stomach. Postoperative changes in the mediastinum. Cardiac pacemaker. Mild cardiac enlargement. Perihilar interstitial changes bilaterally suggesting edema. Superimposed atelectasis or consolidation in the right upper lung. Calcification of the aorta. No pleural effusions. No pneumothorax. IMPRESSION: Appliances appear in satisfactory location. Cardiac enlargement with perihilar edema. Superimposed atelectasis or consolidation in the right upper lung. Electronically Signed   By: Lucienne Capers M.D.   On: 11/03/2018 01:27   Dg Chest Port 1 View  Result Date: 11/03/2018 CLINICAL DATA:  84 y/o  F; respiratory distress. EXAM: PORTABLE CHEST 1 VIEW COMPARISON:  None. FINDINGS: Stable cardiomegaly given projection and technique. Aortic calcific atherosclerosis. Post median  sternotomy with wires in alignment. Two lead pacemaker. Reticular and peripheral linear opacities of the lungs. Small right effusion. No acute osseous abnormality is evident. IMPRESSION: 1. Interstitial pulmonary edema.  Small right effusion. 2. Cardiomegaly. 3.  Aortic Atherosclerosis (ICD10-I70.0). Electronically Signed   By: Kristine Garbe M.D.   On: 11/03/2018 01:06    Pending Labs [employer] (From admission, onward)    Start     Ordered   Signed and Held  TSH  Add-on,   R     Signed and Held          Vitals/Pain Today's Vitals   11/03/18 0144 11/03/18 0200 11/03/18 0230 11/03/18 0300  BP:  (!) 163/68 (!) 186/76 (!) 176/73  Pulse:  69 61 (!) 59  Resp:  10 17 17   Temp:  (!) 96.3 F (35.7 C) (!) 95.7 F (35.4 C) (!) 95.5 F (35.3 C)  TempSrc:      SpO2:  99% 100% 100%  Weight:      Height:      PainSc: Asleep  Isolation Precautions No active isolations  Medications Medications  propofol (DIPRIVAN) 1000 MG/100ML infusion (50 mcg/kg/min  50.2 kg Intravenous Bolus 11/03/18 0112)  fentaNYL 253mcg in NS 266mL (61mcg/ml) infusion-PREMIX (100 mcg/hr Intravenous New Bag/Given 11/03/18 0110)  fentaNYL (SUBLIMAZE) injection 150 mcg (150 mcg Intravenous Given 11/03/18 0100)  rocuronium (ZEMURON) injection 60 mg (60 mg Intravenous Given 11/03/18 0100)  ketamine (KETALAR) injection 100 mg (100 mg Intravenous Given 11/03/18 0100)  furosemide (LASIX) injection 80 mg (80 mg Intravenous Given 11/03/18 0115)    Mobility walks

## 2018-11-03 NOTE — H&P (Signed)
Jacqueline Ramos is an 84 y.o. female.   Chief Complaint: Shortness of breath HPI: The patient with past medical history of CHF, hypertension, diabetes, CAD status post CABG and pacemaker placement presents to the emergency department via EMS after acute onset of shortness of breath.  The patient reported that her dyspnea began after dinner.  She denied chest pain at the time.  Chest x-ray showed pulmonary edema and the patient was given Lasix IV.  She was started on BiPAP for increased work of breathing but began to fatigue and was intubated for respiratory failure.  Once stabilized emergency department staff call hospitalist service for admission.  Past Medical History:  Diagnosis Date  . CHF (congestive heart failure) ([place])   . Diabetes mellitus without complication ([place])   . Hypertension   . Renal disorder     Past Surgical History:  Procedure Laterality Date  . CORONARY ARTERY BYPASS GRAFT    . PACEMAKER PLACEMENT  2002    Family History  Problem Relation Age of Onset  . Obesity Daughter    Social History:  reports that she has never smoked. She has never used smokeless tobacco. She reports that she does not drink alcohol or use drugs.  Allergies: No Known Allergies  Medications Prior to Admission  Medication Sig Dispense Refill  . amLODipine (NORVASC) 10 MG tablet Take 10 mg by mouth daily.    . carvedilol (COREG) 12.5 MG tablet Take 25 mg by mouth 2 (two) times daily with a meal.    . hydrALAZINE (APRESOLINE) 50 MG tablet Take 50 mg by mouth 3 (three) times daily.    . insulin aspart (NOVOLOG) 100 UNIT/ML injection Inject 8 Units into the skin 3 (three) times daily before meals.    . isosorbide mononitrate (IMDUR) 60 MG 24 hr tablet Take 60 mg by mouth daily.    Marland Kitchen levothyroxine (SYNTHROID, LEVOTHROID) 112 MCG tablet Take 112 mcg by mouth daily before breakfast.    . lovastatin (MEVACOR) 40 MG tablet Take 40 mg by mouth at bedtime.    . sevelamer carbonate (RENVELA) 800 MG  tablet Take 800 mg by mouth 3 (three) times daily with meals.    . torsemide (DEMADEX) 10 MG tablet Take 10 mg by mouth daily. May take an extra tablet if short of breath    . vitamin B-12 (CYANOCOBALAMIN) 1000 MCG tablet Take 1,000 mcg by mouth daily.    . nitroGLYCERIN (NITROSTAT) 0.4 MG SL tablet Place 0.4 mg under the tongue every 5 (five) minutes as needed for chest pain.      Results for orders placed or performed during the hospital encounter of 11/03/18 (from the past 48 hour(s))  I-STAT, chem 8     Status: Abnormal   Collection Time: 11/03/18 12:38 AM  Result Value Ref Range   Sodium 142 135 - 145 mmol/L   Potassium 5.3 (H) 3.5 - 5.1 mmol/L   Chloride 115 (H) 98 - 111 mmol/L   BUN 76 (H) 8 - 23 mg/dL   Creatinine, Ser 5.50 (H) 0.44 - 1.00 mg/dL   Glucose, Bld 254 (H) 70 - 99 mg/dL   Calcium, Ion 1.19 1.15 - 1.40 mmol/L   TCO2 20 (L) 22 - 32 mmol/L   Hemoglobin 11.9 (L) 12.0 - 15.0 g/dL   HCT 35.0 (L) 36.0 - 46.0 %  Blood gas, venous     Status: Abnormal   Collection Time: 11/03/18 12:40 AM  Result Value Ref Range   FIO2 100.00  Delivery systems BILEVEL POSITIVE AIRWAY PRESSURE    pH, Ven 7.22 (L) 7.250 - 7.430    Comment: CRITICAL RESULT CALLED TO, READ BACK BY AND VERIFIED WITH: DR.RIFENBARK AT 0047 [id number] BY SMATHEW RRT    pCO2, Ven 42 (L) 44.0 - 60.0 mmHg   pO2, Ven 54.0 (H) 32.0 - 45.0 mmHg   Bicarbonate 17.2 (L) 20.0 - 28.0 mmol/L   Acid-base deficit 10.1 (H) 0.0 - 2.0 mmol/L   O2 Saturation 80.1 %   Patient temperature 37.0    Collection site VENOUS    Sample type VENOUS    Mechanical Rate 8     Comment: Performed at [facility], [address] [place], [place] [address]  Comprehensive metabolic panel     Status: Abnormal   Collection Time: 11/03/18 12:40 AM  Result Value Ref Range   Sodium 140 135 - 145 mmol/L   Potassium 4.6 3.5 - 5.1 mmol/L   Chloride 114 (H) 98 - 111 mmol/L   CO2 16 (L) 22 - 32 mmol/L   Glucose, Bld 250 (H) 70 - 99  mg/dL   BUN 68 (H) 8 - 23 mg/dL   Creatinine, Ser 4.91 (H) 0.44 - 1.00 mg/dL   Calcium 8.8 (L) 8.9 - 10.3 mg/dL   Total Protein 8.3 (H) 6.5 - 8.1 g/dL   Albumin 3.5 3.5 - 5.0 g/dL   AST 53 (H) 15 - 41 U/L   ALT 33 0 - 44 U/L   Alkaline Phosphatase 123 38 - 126 U/L   Total Bilirubin 0.5 0.3 - 1.2 mg/dL   GFR calc non Af Amer 8 (L) >60 mL/min   GFR calc Af Amer 9 (L) >60 mL/min   Anion gap 10 5 - 15    Comment: Performed at [facility], [address] [place], [place] [address]  Brain natriuretic peptide     Status: Abnormal   Collection Time: 11/03/18 12:40 AM  Result Value Ref Range   B Natriuretic Peptide 2,382.0 (H) 0.0 - 100.0 pg/mL    Comment: Performed at [facility], [address] [place], [place] [address]  Troponin I - Once     Status: None   Collection Time: 11/03/18 12:40 AM  Result Value Ref Range   Troponin I <0.03 <0.03 ng/mL    Comment: Performed at [facility], [address] [place], [place] [address]  CBC with Differential     Status: Abnormal   Collection Time: 11/03/18 12:40 AM  Result Value Ref Range   WBC 10.1 4.0 - 10.5 K/uL   RBC 4.38 3.87 - 5.11 MIL/uL   Hemoglobin 10.1 (L) 12.0 - 15.0 g/dL   HCT 32.6 (L) 36.0 - 46.0 %   MCV 74.4 (L) 80.0 - 100.0 fL   MCH 23.1 (L) 26.0 - 34.0 pg   MCHC 31.0 30.0 - 36.0 g/dL   RDW 15.9 (H) 11.5 - 15.5 %   Platelets 204 150 - 400 K/uL   nRBC 0.0 0.0 - 0.2 %   Neutrophils Relative % 68 %   Neutro Abs 6.9 1.7 - 7.7 K/uL   Lymphocytes Relative 22 %   Lymphs Abs 2.3 0.7 - 4.0 K/uL   Monocytes Relative 7 %   Monocytes Absolute 0.7 0.1 - 1.0 K/uL   Eosinophils Relative 2 %   Eosinophils Absolute 0.2 0.0 - 0.5 K/uL   Basophils Relative 0 %   Basophils Absolute 0.0 0.0 - 0.1 K/uL   Immature Granulocytes 1 %  Abs Immature Granulocytes 0.05 0.00 - 0.07 K/uL    Comment: Performed at [facility] [address] [place], [place] [address]  Influenza panel by PCR (type A & B)      Status: None   Collection Time: 11/03/18 12:40 AM  Result Value Ref Range   Influenza A By PCR NEGATIVE NEGATIVE   Influenza B By PCR NEGATIVE NEGATIVE    Comment: (NOTE) The Xpert Xpress Flu assay is intended as an aid in the diagnosis of  influenza and should not be used as a sole basis for treatment.  This  assay is FDA approved for nasopharyngeal swab specimens only. Nasal  washings and aspirates are unacceptable for Xpert Xpress Flu testing. Performed at [facility], [address] [place], [place] [address]   TSH     Status: Abnormal   Collection Time: 11/03/18 12:40 AM  Result Value Ref Range   TSH 7.849 (H) 0.350 - 4.500 uIU/mL    Comment: Performed by a 3rd Generation assay with a functional sensitivity of <=0.01 uIU/mL. Performed at [facility], [address] [place], [place] [address]   Salicylate level     Status: None   Collection Time: 11/03/18  1:44 AM  Result Value Ref Range   Salicylate Lvl <3.7 2.8 - 30.0 mg/dL    Comment: Performed at [facility], [address] [place], [place] [address]  Blood gas, arterial     Status: Abnormal   Collection Time: 11/03/18  2:24 AM  Result Value Ref Range   FIO2 100.00    Delivery systems VENTILATOR    Mode ASSIST CONTROL    VT 450 mL   Peep/cpap 5.0 cm H20   pH, Arterial 7.30 (L) 7.350 - 7.450   pCO2 arterial 35 32.0 - 48.0 mmHg   pO2, Arterial 168 (H) 83.0 - 108.0 mmHg   Bicarbonate 17.2 (L) 20.0 - 28.0 mmol/L   Acid-base deficit 8.4 (H) 0.0 - 2.0 mmol/L   O2 Saturation 99.4 %   Patient temperature 37.0    Collection site LEFT RADIAL    Sample type ARTERIAL DRAW    Allens test (pass/fail) PASS PASS   Mechanical Rate 15     Comment: Performed at [facility], [address] [place], [place] [address]  MRSA PCR Screening     Status: None   Collection Time: 11/03/18  4:03 AM  Result Value Ref Range   MRSA by PCR NEGATIVE NEGATIVE    Comment:        The GeneXpert  MRSA Assay (FDA approved for NASAL specimens only), is one component of a comprehensive MRSA colonization surveillance program. It is not intended to diagnose MRSA infection nor to guide or monitor treatment for MRSA infections. Performed at [facility], [address] [place], [place] [address]    Dg Chest Port 1 View  Result Date: 11/03/2018 CLINICAL DATA:  Intubation EXAM: PORTABLE CHEST 1 VIEW COMPARISON:  11/03/2018 FINDINGS: An endotracheal tube is been placed with tip measuring 2.2 cm above the carina. Enteric tube placed, partially off the field of view but tip is in the left upper quadrant consistent with location in the upper stomach. Postoperative changes in the mediastinum. Cardiac pacemaker. Mild cardiac enlargement. Perihilar interstitial changes bilaterally suggesting edema. Superimposed atelectasis or consolidation in the right upper lung. Calcification of the aorta. No pleural effusions. No pneumothorax. IMPRESSION: Appliances appear in satisfactory location. Cardiac enlargement with perihilar edema. Superimposed atelectasis or consolidation in  the right upper lung. Electronically Signed   By: Lucienne Capers M.D.   On: 11/03/2018 01:27   Dg Chest Port 1 View  Result Date: 11/03/2018 CLINICAL DATA:  84 y/o  F; respiratory distress. EXAM: PORTABLE CHEST 1 VIEW COMPARISON:  None. FINDINGS: Stable cardiomegaly given projection and technique. Aortic calcific atherosclerosis. Post median sternotomy with wires in alignment. Two lead pacemaker. Reticular and peripheral linear opacities of the lungs. Small right effusion. No acute osseous abnormality is evident. IMPRESSION: 1. Interstitial pulmonary edema.  Small right effusion. 2. Cardiomegaly. 3.  Aortic Atherosclerosis (ICD10-I70.0). Electronically Signed   By: Kristine Garbe M.D.   On: 11/03/2018 01:06    Review of Systems  Unable to perform ROS: Intubated    Blood pressure (!) 118/59, pulse 60,  temperature (!) 94.8 F (34.9 C), temperature source Bladder, resp. rate 15, height 4\' 6"  (1.372 m), weight 50.4 kg, SpO2 100 %. Physical Exam  Vitals reviewed. Constitutional: She is oriented to person, place, and time. She appears well-developed and well-nourished. No distress. She is intubated.  HENT:  Head: Normocephalic and atraumatic.  Mouth/Throat: Oropharynx is clear and moist.  Eyes: Pupils are equal, round, and reactive to light. Conjunctivae and EOM are normal. No scleral icterus.  Neck: Normal range of motion. Neck supple. No JVD present. No tracheal deviation present. No thyromegaly present.  Cardiovascular: Normal rate, regular rhythm and normal heart sounds. Exam reveals no gallop and no friction rub.  No murmur heard. Respiratory: Effort normal and breath sounds normal. She is intubated.  GI: Soft. Bowel sounds are normal. She exhibits no distension. There is no abdominal tenderness.  Genitourinary:    Genitourinary Comments: Deferred   Musculoskeletal: Normal range of motion.        General: No edema.  Lymphadenopathy:    She has no cervical adenopathy.  Neurological: She is alert and oriented to person, place, and time. No cranial nerve deficit. She exhibits normal muscle tone.  Skin: Skin is warm and dry. No rash noted. No erythema.  Psychiatric:  Intubated and sedated     Assessment/Plan This is an 84 year old female admitted for CHF exacerbation. 1.  CHF: Acute on chronic; systolic.  Last EF 35 to 40% measured in March 2019.  Continue diuresis.  Heart rate optimized.  Monitor telemetry.  Consider repeat echocardiogram. 2.  Respiratory failure: Acute; with hypoxemia.  May be a component of pneumonia as there is consolidation superimposed upon perihilar edema seen on chest x-ray.  The patient is also mildly hypothermic although this may be environmental.  Monitor for persistent signs or symptoms of sepsis.  Wean supplemental O2 as tolerated.  Continue mechanical  ventilation. 3.  Hypertension: Controlled now.  Continue beta-blocker.  Resume ACE inhibitor once patient is extubated and taking p.o.  Consider discontinuation of amlodipine or substitute hydralazine. 4.  CAD: Stable; continue Imdur 5.  Diabetes mellitus type 2: Sliding scale insulin while hospitalized.  Check hemoglobin A1c. 6.  Hypothyroidism: Check TSH; continue Synthroid 7.  CKD: ESRD not on dialysis.  Continue Renvela.  Strict I's and O's. 8.  DVT prophylaxis: Heparin 9.  GI prophylaxis: Pantoprazole once intubated 24 hours. The patient is a full code.  I have personally spent 45 minutes in critical care time with this patient.  Harrie Foreman, MD 11/03/2018, 6:56 AM

## 2018-11-03 NOTE — Consult Note (Signed)
PULMONARY / CRITICAL CARE MEDICINE  Name: Jacqueline Ramos MRN: [id number] DOB: [birth year]    LOS: 0  Referring Provider:  Dr Marcille Blanco Reason for Referral:  Acute dyspnea and respiratory failure Brief patient description: 84 year old female admitted with acute respiratory failure secondary to acute pulmonary edema and acute CHF exacerbation. HPI: This is an 84 year old African-American female with a past medical history as indicated below, end-stage renal disease not on dialysis, who presented to the ED with acute dyspnea.  History is obtained from ED records and from patient's daughter as patient is currently intubated and sedated.  Per ED records, patient indicated that after she finished eating dinner, she started experiencing shortness of breath.  When EMS arrived, her SPO2 was 90% on room air and her blood pressure was 200/100 with a respiratory rate of 30.  Upon arrival in the ED, her SPO2 was 80% on room air.  She was placed on BiPAP but remains severely tachypneic and hypoxic hence she was intubated.  Her chest x-ray showed pulmonary edema with slight consolidation in the right upper lobe and her labs showed a potassium of 5.3, glucose of 254, creatinine of 5.50, proBNP of 2382, and a TSH of 7.849.  Rapid influenza swab was negative.  She is being admitted to the ICU for further management  Past Medical History:  Diagnosis Date  . CHF (congestive heart failure) ([place])   . Diabetes mellitus without complication ([place])   . Hypertension   . Renal disorder    Past Surgical History:  Procedure Laterality Date  . CORONARY ARTERY BYPASS GRAFT    . PACEMAKER PLACEMENT  2002   Prior to Admission medications   Medication Sig Start Date End Date Taking? Authorizing Provider  amLODipine (NORVASC) 5 MG tablet Take 5 mg by mouth daily.   Yes [provider]  clopidogrel (PLAVIX) 75 MG tablet Take 75 mg by mouth daily.   Yes [provider]  donepezil (ARICEPT) 5 MG tablet Take 1  tablet (5 mg total) by mouth at bedtime. 06/18/18 07/28/18 Yes Sowles, Drue Stager, MD  empagliflozin (JARDIANCE) 25 MG TABS tablet Take 25 mg by mouth daily.   Yes [provider]  glycopyrrolate (ROBINUL) 1 MG tablet Take 1 mg by mouth 2 (two) times daily.   Yes [provider]  insulin aspart (NOVOLOG FLEXPEN) 100 UNIT/ML FlexPen Inject 12 Units into the skin 2 (two) times daily.   Yes [provider]  insulin aspart (NOVOLOG) 100 UNIT/ML FlexPen Inject 18 Units into the skin daily. At 1700   Yes [provider]  Insulin Degludec-Liraglutide (XULTOPHY) 100-3.6 UNIT-MG/ML SOPN Inject 50 Units into the skin daily.   Yes [provider]  levETIRAcetam (KEPPRA) 500 MG tablet Take 500 mg by mouth 2 (two) times daily.   Yes [provider]  lipase/protease/amylase (CREON) 12000 units CPEP capsule Take 6,000 Units by mouth 3 (three) times daily before meals.   Yes [provider]  lipase/protease/amylase (CREON) 12000 units CPEP capsule Take 3,000 Units by mouth at bedtime. With snack   Yes [provider]  lisinopril (PRINIVIL,ZESTRIL) 5 MG tablet Take 5 mg by mouth daily.   Yes [provider]  metoprolol succinate (TOPROL-XL) 25 MG 24 hr tablet Take 1 tablet (25 mg total) by mouth daily. 06/18/18  Yes Sowles, Drue Stager, MD  rosuvastatin (CRESTOR) 40 MG tablet Take 1 tablet (40 mg total) by mouth daily. 06/18/18 07/28/18 Yes Steele Sizer, MD  aspirin EC 81 MG tablet Take 81 mg  by mouth daily.    [provider]  famotidine (PEPCID) 20 MG tablet Take 1 tablet (20 mg total) by mouth 2 (two) times daily. 06/18/18 07/18/18  Steele Sizer, MD  gabapentin (NEURONTIN) 300 MG capsule Take 1 capsule (300 mg total) by mouth 2 (two) times daily. 06/18/18 07/18/18  Steele Sizer, MD  insulin glargine (LANTUS) 100 UNIT/ML injection Inject 0.1 mLs (10 Units total) into the skin daily. 06/18/18 07/18/18  Steele Sizer, MD  lacosamide  100 MG TABS Take 1 tablet (100 mg total) by mouth 2 (two) times daily. Patient not taking: Reported on 07/28/2018 11/03/17   Fritzi Mandes, MD  promethazine (PHENERGAN) 12.5 MG tablet Take 1 tablet (12.5 mg total) by mouth every 6 (six) hours as needed for nausea or vomiting. Patient not taking: Reported on 07/28/2018 08/22/17   Stark Klein, MD  sertraline (ZOLOFT) 25 MG tablet Take 1 tablet (25 mg total) by mouth daily. Patient not taking: Reported on 07/28/2018 06/18/18   Steele Sizer, MD   Allergies No Known Allergies  Family History Family History  Problem Relation Age of Onset  . Obesity Daughter    Social History  reports that she has never smoked. She has never used smokeless tobacco. She reports that she does not drink alcohol or use drugs.  Review Of Systems: Unable to obtain as patient is intubated and sedated  VITAL SIGNS: BP (!) 118/59   Pulse 60   Temp (!) 94.8 F (34.9 C) (Bladder)   Resp 15   Ht 4\' 6"  (1.372 m)   Wt 50.4 kg   SpO2 100%   BMI 26.79 kg/m   HEMODYNAMICS:    VENTILATOR SETTINGS: Vent Mode: PRVC FiO2 (%):  [30 %-100 %] 30 % Set Rate:  [15 bmp] 15 bmp Vt Set:  [450 mL] 450 mL PEEP:  [5 cmH20] 5 cmH20  INTAKE / OUTPUT: No intake/output data recorded.  PHYSICAL EXAMINATION: General: Sedated, appears younger for age 84: PERRLA, trachea midline, no JVD Neuro: Withdraws to noxious stimulus, corneal and gag reflexes intact Cardiovascular: Apical pulse regular, S1-S2, no murmur regurg or gallop, +2 pulses bilaterally, no edema Lungs: Bilateral breath sounds, diminished in the bases, no wheezes or rhonchi Abdomen: Nondistended, normal bowel sounds in all 4 quadrants, palpation reveals no organomegaly Musculoskeletal: No joint deformities, positive range of motion in upper and lower extremities Skin: Warm and dry  LABS:  BMET Recent Labs  Lab 11/03/18 0038 11/03/18 0040  NA 142 140  K 5.3* 4.6  CL 115* 114*  CO2  --  16*  BUN 76* 68*   CREATININE 5.50* 4.91*  GLUCOSE 254* 250*    Electrolytes Recent Labs  Lab 11/03/18 0040  CALCIUM 8.8*    CBC Recent Labs  Lab 11/03/18 0038 11/03/18 0040  WBC  --  10.1  HGB 11.9* 10.1*  HCT 35.0* 32.6*  PLT  --  204    Coag's No results for input(s): APTT, INR in the last 168 hours.  Sepsis Markers No results for input(s): LATICACIDVEN, PROCALCITON, O2SATVEN in the last 168 hours.  ABG Recent Labs  Lab 11/03/18 0224  PHART 7.30*  PCO2ART 35  PO2ART 168*    Liver Enzymes Recent Labs  Lab 11/03/18 0040  AST 53*  ALT 33  ALKPHOS 123  BILITOT 0.5  ALBUMIN 3.5    Cardiac Enzymes Recent Labs  Lab 11/03/18 0040  TROPONINI <0.03    Glucose No results for input(s): GLUCAP in the last 168 hours.  Imaging  Dg Chest Port 1 View  Result Date: 11/03/2018 CLINICAL DATA:  Intubation EXAM: PORTABLE CHEST 1 VIEW COMPARISON:  11/03/2018 FINDINGS: An endotracheal tube is been placed with tip measuring 2.2 cm above the carina. Enteric tube placed, partially off the field of view but tip is in the left upper quadrant consistent with location in the upper stomach. Postoperative changes in the mediastinum. Cardiac pacemaker. Mild cardiac enlargement. Perihilar interstitial changes bilaterally suggesting edema. Superimposed atelectasis or consolidation in the right upper lung. Calcification of the aorta. No pleural effusions. No pneumothorax. IMPRESSION: Appliances appear in satisfactory location. Cardiac enlargement with perihilar edema. Superimposed atelectasis or consolidation in the right upper lung. Electronically Signed   By: Lucienne Capers M.D.   On: 11/03/2018 01:27   Dg Chest Port 1 View  Result Date: 11/03/2018 CLINICAL DATA:  84 y/o  F; respiratory distress. EXAM: PORTABLE CHEST 1 VIEW COMPARISON:  None. FINDINGS: Stable cardiomegaly given projection and technique. Aortic calcific atherosclerosis. Post median sternotomy with wires in alignment. Two lead  pacemaker. Reticular and peripheral linear opacities of the lungs. Small right effusion. No acute osseous abnormality is evident. IMPRESSION: 1. Interstitial pulmonary edema.  Small right effusion. 2. Cardiomegaly. 3.  Aortic Atherosclerosis (ICD10-I70.0). Electronically Signed   By: Kristine Garbe M.D.   On: 11/03/2018 01:06   STUDIES:  2D echo pending  CULTURES: None  ANTIBIOTICS: None  SIGNIFICANT EVENTS: 11/03/2018: Admitted  LINES/TUBES: Peripheral IV  DISCUSSION: 84 year old female with end-stage renal disease and congestive heart failure presenting with acute pulmonary edema, hypertensive emergency, acute hypoxic respiratory failure and acute CHF exacerbation  ASSESSMENT / PLAN:  PULMONARY A: Acute hypoxic respiratory failure secondary to pulmonary edema P:   Full vent support with current settings ABG and chest x-ray as needed Nebulized bronchodilators IV diuretics  CARDIOVASCULAR A:  Acute CHF exacerbation Hypertensive emergency Paroxysmal atrial fibrillation Hyperlipidemia associated with type 2 diabetes Coronary artery disease status post coronary artery bypass graft and pacemaker placement P:  Blood pressure improved with sedation Hemodynamic monitoring per ICU protocol IV diuretics Resume home dose of Coreg, hydralazine, Imdur, lovastatin and amlodipine  RENAL A:   End-stage renal disease Hyperkalemia P:   Patient may require hemodialysis Nephrology consulted Monitor and correct electrolytes   ENDOCRINE A:   Type 2 diabetes with diabetic P:   Blood glucose monitoring with sliding scale insulin coverage Hemoglobin A1c with a.m. labs  NEUROLOGIC A:   Acute hypoxic encephalopathy P:   RASS goal: 0 to -1 Fentanyl propofol for vent sedation and comfort Monitor neurological status  Best Practice: Code Status: Full code Diet: N.p.o. GI prophylaxis: Protonix VTE prophylaxis: Subcu heparin  FAMILY  - Updates: Daughter updated at  bedside.  All questions answered  Shamon Cothran S. [facility] ANP-BC Pulmonary and Critical Care Medicine [facility] Pager [phone] or [phone]  NB: This document was prepared using Dragon voice recognition software and may include unintentional dictation errors.    11/03/2018, 6:59 AM

## 2018-11-03 NOTE — Progress Notes (Signed)
*  PRELIMINARY RESULTS* Echocardiogram 2D Echocardiogram has been performed.  Jacqueline Ramos Jacqueline Ramos 11/03/2018, 11:27 AM

## 2018-11-04 LAB — GLUCOSE, CAPILLARY
Glucose-Capillary: 137 mg/dL — ABNORMAL HIGH (ref 70–99)
Glucose-Capillary: 108 mg/dL — ABNORMAL HIGH (ref 70–99)
Glucose-Capillary: 114 mg/dL — ABNORMAL HIGH (ref 70–99)
Glucose-Capillary: 314 mg/dL — ABNORMAL HIGH (ref 70–99)
Glucose-Capillary: 80 mg/dL (ref 70–99)

## 2018-11-04 LAB — MAGNESIUM: Magnesium: 2.1 mg/dL (ref 1.7–2.4)

## 2018-11-04 LAB — BASIC METABOLIC PANEL
ANION GAP: 11 (ref 5–15)
BUN: 76 mg/dL — ABNORMAL HIGH (ref 8–23)
CO2: 17 mmol/L — AB (ref 22–32)
Calcium: 8.4 mg/dL — ABNORMAL LOW (ref 8.9–10.3)
Chloride: 112 mmol/L — ABNORMAL HIGH (ref 98–111)
Creatinine, Ser: 5.37 mg/dL — ABNORMAL HIGH (ref 0.44–1.00)
GFR calc non Af Amer: 7 mL/min — ABNORMAL LOW (ref >60)
GFR, EST AFRICAN AMERICAN: 8 mL/min — AB (ref >60)
Glucose, Bld: 155 mg/dL — ABNORMAL HIGH (ref 70–99)
Potassium: 4.5 mmol/L (ref 3.5–5.1)
Sodium: 140 mmol/L (ref 135–145)

## 2018-11-04 LAB — CBC
HCT: 29.7 % — ABNORMAL LOW (ref 36.0–46.0)
Hemoglobin: 9.1 g/dL — ABNORMAL LOW (ref 12.0–15.0)
MCH: 23.1 pg — AB (ref 26.0–34.0)
MCHC: 30.6 g/dL (ref 30.0–36.0)
MCV: 75.4 fL — ABNORMAL LOW (ref 80.0–100.0)
Platelets: 169 10*3/uL (ref 150–400)
RBC: 3.94 MIL/uL (ref 3.87–5.11)
RDW: 15.9 % — ABNORMAL HIGH (ref 11.5–15.5)
WBC: 8.9 10*3/uL (ref 4.0–10.5)
nRBC: 0 % (ref 0.0–0.2)

## 2018-11-04 LAB — HEMOGLOBIN A1C
Hgb A1c MFr Bld: 8 % — ABNORMAL HIGH (ref 4.8–5.6)
Mean Plasma Glucose: 182.9 mg/dL

## 2018-11-04 LAB — PHOSPHORUS: Phosphorus: 6.3 mg/dL — ABNORMAL HIGH (ref 2.5–4.6)

## 2018-11-04 MED ORDER — INSULIN ASPART 100 UNIT/ML ~~LOC~~ SOLN: 0.0000 [IU] | SUBCUTANEOUS | Status: DC

## 2018-11-04 MED ORDER — SEVELAMER CARBONATE 800 MG PO TABS
800.0000 mg | ORAL_TABLET | Freq: Three times a day (TID) | ORAL | Status: DC
  Administered 2018-11-04 – 2018-11-05 (×2): 800 mg via ORAL
  Filled 2018-11-04 (×4): qty 1

## 2018-11-04 MED ORDER — CARVEDILOL 25 MG PO TABS
25.0000 mg | ORAL_TABLET | Freq: Two times a day (BID) | ORAL | Status: DC
  Administered 2018-11-04 – 2018-11-05 (×3): 25 mg via ORAL
  Filled 2018-11-04: qty 2
  Filled 2018-11-04: qty 1
  Filled 2018-11-04: qty 2

## 2018-11-04 MED ORDER — PRAVASTATIN SODIUM 20 MG PO TABS
40.0000 mg | ORAL_TABLET | Freq: Every day | ORAL | Status: DC
  Administered 2018-11-04: 40 mg via ORAL
  Filled 2018-11-04: qty 2

## 2018-11-04 MED ORDER — ISOSORBIDE MONONITRATE ER 30 MG PO TB24
60.0000 mg | ORAL_TABLET | Freq: Every day | ORAL | Status: DC
  Administered 2018-11-04 – 2018-11-05 (×2): 60 mg via ORAL
  Filled 2018-11-04 (×3): qty 2

## 2018-11-04 MED ORDER — INSULIN ASPART 100 UNIT/ML ~~LOC~~ SOLN: 0.0000 [IU] | Freq: Every day | SUBCUTANEOUS | Status: DC

## 2018-11-04 MED ORDER — HYDRALAZINE HCL 50 MG PO TABS
50.0000 mg | ORAL_TABLET | Freq: Three times a day (TID) | ORAL | Status: DC
  Administered 2018-11-04 – 2018-11-05 (×4): 50 mg via ORAL
  Filled 2018-11-04 (×4): qty 1

## 2018-11-04 MED ORDER — AMLODIPINE BESYLATE 10 MG PO TABS
10.0000 mg | ORAL_TABLET | Freq: Every day | ORAL | Status: DC
  Administered 2018-11-04 – 2018-11-05 (×2): 10 mg via ORAL
  Filled 2018-11-04 (×2): qty 1

## 2018-11-04 MED ORDER — IPRATROPIUM-ALBUTEROL 0.5-2.5 (3) MG/3ML IN SOLN
3.0000 mL | Freq: Four times a day (QID) | RESPIRATORY_TRACT | Status: DC | PRN
  Administered 2018-11-04 – 2018-11-05 (×3): 3 mL via RESPIRATORY_TRACT
  Filled 2018-11-04 (×3): qty 3

## 2018-11-04 MED ORDER — GUAIFENESIN-DM 100-10 MG/5ML PO SYRP
5.0000 mL | ORAL_SOLUTION | ORAL | Status: DC | PRN
  Administered 2018-11-04 (×3): 5 mL via ORAL
  Filled 2018-11-04 (×5): qty 5

## 2018-11-04 MED ORDER — TORSEMIDE 20 MG PO TABS
20.0000 mg | ORAL_TABLET | Freq: Every day | ORAL | Status: DC
  Administered 2018-11-04: 20 mg via ORAL
  Filled 2018-11-04: qty 1

## 2018-11-04 MED ORDER — INSULIN ASPART 100 UNIT/ML ~~LOC~~ SOLN: 0.0000 [IU] | Freq: Three times a day (TID) | SUBCUTANEOUS | Status: DC

## 2018-11-04 MED ORDER — INSULIN ASPART 100 UNIT/ML ~~LOC~~ SOLN
0.0000 [IU] | Freq: Three times a day (TID) | SUBCUTANEOUS | Status: DC
  Administered 2018-11-04: 11 [IU] via SUBCUTANEOUS
  Administered 2018-11-05: 2 [IU] via SUBCUTANEOUS
  Filled 2018-11-04 (×2): qty 1

## 2018-11-04 NOTE — Consult Note (Signed)
Date: 11/04/2018                  Patient Name:  Jacqueline Ramos  MRN: [id number]  DOB: 08/14/1935  Age / Sex: 84 y.o., female         PCP: Patient, No Pcp Per                 Service Requesting Consult:  Internal medicine/ Demetrios Loll, MD                 Reason for Consult:  Chronic kidney disease stage V            History of Present Illness: Patient is a 84 y.o. female with medical problems of diabetes, hypertension, coronary disease status post CABG, pacemaker Thomas severe systolic congestive heart failure, who was admitted to [facility] on 11/03/2018 for evaluation of cute respiratory distress.  Patient presented to emergency room via EMS with respiratory distress.  Her blood pressure was elevated upon arrival at 200/100 and she had increased respiratory rate of 30.  Patient required intubation and mechanical ventilation for acute respiratory failure due to pulmonary edema.  Patient was extubated this morning.  At present, she is room air with no respiratory distress.  Oxygen saturation 93% Patient is followed by [facility] nephrology.  She has advanced CKD but has declined dialysis.   Medications: Outpatient medications: Medications Prior to Admission  Medication Sig Dispense Refill Last Dose  . amLODipine (NORVASC) 10 MG tablet Take 10 mg by mouth daily.   Past Week at Unknown time  . carvedilol (COREG) 12.5 MG tablet Take 25 mg by mouth 2 (two) times daily with a meal.   Past Week at Unknown time  . hydrALAZINE (APRESOLINE) 50 MG tablet Take 50 mg by mouth 3 (three) times daily.   Past Week at Unknown time  . insulin aspart (NOVOLOG) 100 UNIT/ML injection Inject 8 Units into the skin 3 (three) times daily before meals.   Past Week at Unknown time  . isosorbide mononitrate (IMDUR) 60 MG 24 hr tablet Take 60 mg by mouth daily.   Past Week at Unknown time  . levothyroxine (SYNTHROID, LEVOTHROID) 112 MCG tablet Take 112 mcg by mouth daily before breakfast.   Past Week at Unknown time  .  lovastatin (MEVACOR) 40 MG tablet Take 40 mg by mouth at bedtime.   Past Week at Unknown time  . sevelamer carbonate (RENVELA) 800 MG tablet Take 800 mg by mouth 3 (three) times daily with meals.   Past Week at Unknown time  . torsemide (DEMADEX) 10 MG tablet Take 10 mg by mouth daily. May take an extra tablet if short of breath   Past Week at Unknown time  . vitamin B-12 (CYANOCOBALAMIN) 1000 MCG tablet Take 1,000 mcg by mouth daily.   Past Week at Unknown time  . nitroGLYCERIN (NITROSTAT) 0.4 MG SL tablet Place 0.4 mg under the tongue every 5 (five) minutes as needed for chest pain.   prn at prn    Current medications: Current Facility-Administered Medications  Medication Dose Route Frequency Provider Last Rate Last Dose  . acetaminophen (TYLENOL) tablet 650 mg  650 mg Oral Q6H PRN Harrie Foreman, MD       Or  . acetaminophen (TYLENOL) suppository 650 mg  650 mg Rectal Q6H PRN Harrie Foreman, MD   650 mg at 11/04/18 0450  . amLODipine (NORVASC) tablet 10 mg  10 mg Oral Daily Tyler Pita, MD      .  carvedilol (COREG) tablet 25 mg  25 mg Oral BID WC Tyler Pita, MD      . docusate sodium (COLACE) capsule 100 mg  100 mg Oral BID Harrie Foreman, MD      . heparin injection 5,000 Units  5,000 Units Subcutaneous Q8H Harrie Foreman, MD   5,000 Units at 11/04/18 0500  . hydrALAZINE (APRESOLINE) tablet 50 mg  50 mg Oral TID Tyler Pita, MD      . insulin aspart (novoLOG) injection 0-5 Units  0-5 Units Subcutaneous QHS Tyler Pita, MD      . insulin aspart (novoLOG) injection 0-9 Units  0-9 Units Subcutaneous TID WC Tyler Pita, MD      . ipratropium-albuterol (DUONEB) 0.5-2.5 (3) MG/3ML nebulizer solution 3 mL  3 mL Nebulization Q6H PRN Tukov-Yual, Magdalene S, NP      . isosorbide mononitrate (IMDUR) 24 hr tablet 60 mg  60 mg Oral Daily Tyler Pita, MD      . levothyroxine (SYNTHROID, LEVOTHROID) tablet 112 mcg  112 mcg Per Tube QAC breakfast  Harrie Foreman, MD   112 mcg at 11/04/18 0830  . ondansetron (ZOFRAN) tablet 4 mg  4 mg Oral Q6H PRN Harrie Foreman, MD       Or  . ondansetron [facility]) injection 4 mg  4 mg Intravenous Q6H PRN Harrie Foreman, MD      . pantoprazole (PROTONIX) injection 40 mg  40 mg Intravenous Q24H Elsie Lincoln, MD   40 mg at 11/04/18 0449  . pravastatin (PRAVACHOL) tablet 40 mg  40 mg Oral q1800 Tyler Pita, MD      . sevelamer carbonate (RENVELA) tablet 800 mg  800 mg Oral TID WC Tyler Pita, MD      . torsemide [facility]) tablet 20 mg  20 mg Oral Daily Tyler Pita, MD          Allergies: No Known Allergies    Past Medical History: Past Medical History:  Diagnosis Date  . CHF (congestive heart failure) ([place])   . Coronary artery disease    CAD s/p CABG and pacemaker placement  . End stage renal disease ([place])    Not on dialysis  . Hyperlipidemia associated with type 2 diabetes mellitus ([place])   . Hypertension   . Paroxysmal atrial fibrillation ([facility])   . Type 2 diabetes mellitus with diabetic nephropathy [facility])      Past Surgical History: Past Surgical History:  Procedure Laterality Date  . CORONARY ARTERY BYPASS GRAFT    . PACEMAKER PLACEMENT  2002     Family History: Family History  Problem Relation Age of Onset  . Obesity Daughter      Social History: Social History   Socioeconomic History  . Marital status: Married    Spouse name: Not on file  . Number of children: Not on file  . Years of education: Not on file  . Highest education level: Not on file  Occupational History  . Not on file  Social Needs  . Financial resource strain: Not on file  . Food insecurity:    Worry: Not on file    Inability: Not on file  . Transportation needs:    Medical: Not on file    Non-medical: Not on file  Tobacco Use  . Smoking status: Never Smoker  . Smokeless tobacco: Never Used  Substance and Sexual Activity  . Alcohol use: Never    Frequency: Never   .  Drug use: Never  . Sexual activity: Not on file  Lifestyle  . Physical activity:    Days per week: Not on file    Minutes per session: Not on file  . Stress: Not on file  Relationships  . Social connections:    Talks on phone: Not on file    Gets together: Not on file    Attends religious service: Not on file    Active member of club or organization: Not on file    Attends meetings of clubs or organizations: Not on file    Relationship status: Not on file  . Intimate partner violence:    Fear of current or ex partner: Not on file    Emotionally abused: Not on file    Physically abused: Not on file    Forced sexual activity: Not on file  Other Topics Concern  . Not on file  Social History Narrative  . Not on file     Review of Systems: Gen: Denies any fevers or chills at home HEENT: No vision or hearing complaints CV: No chest pain at present Resp: Some shortness of breath with sputum production at home GI: Appetite has been poor.  Patient reports losing some weight GU : Denies any problems with voiding MS: Able to walk with a cane at home.  Denies any acute arthritis issues Derm:  No complaints Psych: No complaints Heme: No complaints Neuro: No complaints Endocrine.  No complaints  Vital Signs: Blood pressure (!) 147/65, pulse 84, temperature 100 F (37.8 C), resp. rate 19, height 4\' 6"  (1.372 m), weight 49 kg, SpO2 98 %.   Intake/Output Summary (Last 24 hours) at 11/04/2018 0939 Last data filed at 11/04/2018 0500 Gross per 24 hour  Intake 7.1 ml  Output 1775 ml  Net -1767.9 ml    Weight trends: Filed Weights   11/03/18 0035 11/03/18 0530 11/04/18 0455  Weight: 50.2 kg 50.4 kg 49 kg    Physical Exam: General:  No acute distress, laying in the bed  HEENT  anicteric, moist oral mucous membranes  Neck:  Supple, no distended neck veins  Lungs:  Normal breathing effort, mild coarse breath sounds at bases, room air  Heart::  Regular rhythm, no rub    Abdomen:  Soft, nontender, nondistended  Extremities:  No peripheral edema  Neurologic:  Alert, oriented, able to answer questions  Skin:  No acute rashes  Foley:  Foley catheter in place       Lab results: Basic Metabolic Panel: Recent Labs  Lab 11/03/18 0038 11/03/18 0040 11/04/18 0424  NA 142 140 140  K 5.3* 4.6 4.5  CL 115* 114* 112*  CO2  --  16* 17*  GLUCOSE 254* 250* 155*  BUN 76* 68* 76*  CREATININE 5.50* 4.91* 5.37*  CALCIUM  --  8.8* 8.4*  MG  --   --  2.1  PHOS  --   --  6.3*    Liver Function Tests: Recent Labs  Lab 11/03/18 0040  AST 53*  ALT 33  ALKPHOS 123  BILITOT 0.5  PROT 8.3*  ALBUMIN 3.5   No results for input(s): LIPASE, AMYLASE in the last 168 hours. No results for input(s): AMMONIA in the last 168 hours.  CBC: Recent Labs  Lab 11/03/18 0040 11/04/18 0424  WBC 10.1 8.9  NEUTROABS 6.9  --   HGB 10.1* 9.1*  HCT 32.6* 29.7*  MCV 74.4* 75.4*  PLT 204 169    Cardiac Enzymes: Recent Labs  Lab 11/03/18 0040  TROPONINI <0.03    BNP: Invalid input(s): POCBNP  CBG: Recent Labs  Lab 11/03/18 1628 11/03/18 1935 11/03/18 2314 11/04/18 0337 11/04/18 0751  GLUCAP 128* 106* 118* 137* 108*    Microbiology: Recent Results (from the past 720 hour(s))  MRSA PCR Screening     Status: None   Collection Time: 11/03/18  4:03 AM  Result Value Ref Range Status   MRSA by PCR NEGATIVE NEGATIVE Final    Comment:        The GeneXpert MRSA Assay (FDA approved for NASAL specimens only), is one component of a comprehensive MRSA colonization surveillance program. It is not intended to diagnose MRSA infection nor to guide or monitor treatment for MRSA infections. Performed at [facility], [address] [place], [place] [address]      Coagulation Studies: No results for input(s): LABPROT, INR in the last 72 hours.  Urinalysis: No results for input(s): COLORURINE, LABSPEC, PHURINE, GLUCOSEU, HGBUR, BILIRUBINUR,  KETONESUR, PROTEINUR, UROBILINOGEN, NITRITE, LEUKOCYTESUR in the last 72 hours.  Invalid input(s): APPERANCEUR      Imaging: Dg Chest Port 1 View  Result Date: 11/03/2018 CLINICAL DATA:  Intubation EXAM: PORTABLE CHEST 1 VIEW COMPARISON:  11/03/2018 FINDINGS: An endotracheal tube is been placed with tip measuring 2.2 cm above the carina. Enteric tube placed, partially off the field of view but tip is in the left upper quadrant consistent with location in the upper stomach. Postoperative changes in the mediastinum. Cardiac pacemaker. Mild cardiac enlargement. Perihilar interstitial changes bilaterally suggesting edema. Superimposed atelectasis or consolidation in the right upper lung. Calcification of the aorta. No pleural effusions. No pneumothorax. IMPRESSION: Appliances appear in satisfactory location. Cardiac enlargement with perihilar edema. Superimposed atelectasis or consolidation in the right upper lung. Electronically Signed   By: Lucienne Capers M.D.   On: 11/03/2018 01:27   Dg Chest Port 1 View  Result Date: 11/03/2018 CLINICAL DATA:  84 y/o  F; respiratory distress. EXAM: PORTABLE CHEST 1 VIEW COMPARISON:  None. FINDINGS: Stable cardiomegaly given projection and technique. Aortic calcific atherosclerosis. Post median sternotomy with wires in alignment. Two lead pacemaker. Reticular and peripheral linear opacities of the lungs. Small right effusion. No acute osseous abnormality is evident. IMPRESSION: 1. Interstitial pulmonary edema.  Small right effusion. 2. Cardiomegaly. 3.  Aortic Atherosclerosis (ICD10-I70.0). Electronically Signed   By: Kristine Garbe M.D.   On: 11/03/2018 01:06      Assessment & Plan: Pt is a 84 y.o. African-American  female with diabetes, severe hypertension, coronary disease status post CABG, severe congestive heart failure, EF 20 to 25% from January 11,2020 was admitted on 11/03/2018 with severe respiratory distress requiring intubation and mechanical  ventilation  Chronic kidney disease stage V -Patient is [facility] by [facility] nephrology.  Her baseline creatinine is 4.9/GFR 9 from September 2019.  Patient's creatinine is in her usual range.  GFR 8.  Patient has had discussion about dialysis as outpatient and has declined in the past.  We had discussion about dialysis again today.  Patient states that she would not want dialysis even if it is necessary to save her life.  She thinks it may be too hard on her body.  She does not want even the option of trial for a few weeks.  Continue conservative management.  Acute respiratory distress/Acute pulm edema Chronic systolic congestive heart failure.  EF 20 to 25% -Was initially intubated.  Now extubated.  Currently on room air breathing comfortably.  Home meds include torsemide  of 10 mg.  Consider increasing to 40 mg daily. - Fluid restriction of 1200 cc/day  Hypertension with chronic kidney disease -Blood pressure control is acceptable with current regimen     LOS: [place] 1/12/20209:39 AM  [place], [place]  Note: This note was prepared with Dragon dictation. Any transcription errors are unintentional

## 2018-11-04 NOTE — Progress Notes (Signed)
Advanced Care Plan.  Purpose of Encounter: CODE STATUS. Parties in Attendance: The patient, her 2 daughters and me. Patient's Decisional Capacity: Yes. Medical Story: Jacqueline Ramos is an 84 y.o. female with history of CHF, s/p pacemaker, CAD, diabetes, hypertension, CKD stage V.    She is admitted for acute respiratory failure with hypoxia due to acute on chronic systolic CHF.  She was intubated and extubated.  She refused hemodialysis.  I discussed with the patient about her current condition, poor prognosis and CODE STATUS.  The patient stated that she wants to be resuscitated and intubated if she has a cardiopulmonary arrest but she does not want hemodialysis. Plan:  Code Status: Full code  Time spent discussing advance care planning: 17 minutes.

## 2018-11-04 NOTE — Progress Notes (Signed)
Patient ID: Jacqueline Ramos, female   DOB: 1935/02/12, 84 y.o.   MRN: [id number]  The patient was extubated yesterday without sequela.  No respiratory distress noted.  She has responded to Lasix IV for pulmonary edema.  She has remained hemodynamically stable. Exam today shows some persistent rales at the bases.  She is however in no distress.  She is comfortable on room air.  Impression/Recommendations:  1.  Acute hypoxic respiratory failure due to CHF exacerbation, resolved.  Comfortable on room air.  May transfer to [place] ward.  2.  End-stage renal disease stage V, patient declines hemodialysis.  Continue diuretic dose.  She has been on torsemide as an outpatient at 10 mg however responded to the equivalent of 20 mg of IV Lasix.  Will increase torsemide to 20 mg.  3.  Severe ischemic cardiomyopathy ejection fraction of 20 to 25%.  This issue adds complexity to her management particularly in view of her end-stage renal disease.  4.  End-of-life discussion.  Patient still desires full code despite 2 major organ systems being end-stage and desiring no dialysis.  Recommend transfer to [place].

## 2018-11-04 NOTE — Progress Notes (Signed)
[facility] [place] NAME: Jacqueline Ramos    MR#:  [id number]  DATE OF BIRTH:  12-28-1934  SUBJECTIVE:  CHIEF COMPLAINT:   Chief Complaint  Patient presents with  . Respiratory Distress   Patient was extubated, off oxygen.  She has no complaints. REVIEW OF SYSTEMS:  Review of Systems  Constitutional: Negative for chills, fever and malaise/fatigue.  HENT: Negative for sore throat.   Eyes: Negative for blurred vision and double vision.  Respiratory: Negative for cough, hemoptysis, shortness of breath, wheezing and stridor.   Cardiovascular: Negative for chest pain, palpitations, orthopnea and leg swelling.  Gastrointestinal: Negative for abdominal pain, blood in stool, diarrhea, melena, nausea and vomiting.  Genitourinary: Negative for dysuria, flank pain and hematuria.  Musculoskeletal: Negative for back pain and joint pain.  Skin: Negative for rash.  Neurological: Negative for dizziness, sensory change, focal weakness, seizures, loss of consciousness, weakness and headaches.  Endo/Heme/Allergies: Negative for polydipsia.  Psychiatric/Behavioral: Negative for depression. The patient is not nervous/anxious.     DRUG ALLERGIES:  No Known Allergies VITALS:  Blood pressure (!) 119/56, pulse 74, temperature 100 F (37.8 C), resp. rate (!) 23, height 4\' 6"  (1.372 m), weight 49 kg, SpO2 92 %. PHYSICAL EXAMINATION:  Physical Exam Constitutional:      General: She is not in acute distress. HENT:     Head: Normocephalic.     Mouth/Throat:     Mouth: Mucous membranes are moist.  Eyes:     General: No scleral icterus.    Conjunctiva/sclera: Conjunctivae normal.     Pupils: Pupils are equal, round, and reactive to light.  Neck:     Musculoskeletal: Normal range of motion and neck supple.     Vascular: No JVD.     Trachea: No tracheal deviation.  Cardiovascular:     Rate and Rhythm: Normal rate and regular rhythm.     Heart sounds:  Normal heart sounds. No murmur. No gallop.   Pulmonary:     Effort: Pulmonary effort is normal. No respiratory distress.     Breath sounds: Normal breath sounds. No stridor. No wheezing, rhonchi or rales.  Abdominal:     General: Bowel sounds are normal. There is no distension.     Palpations: Abdomen is soft.     Tenderness: There is no abdominal tenderness. There is no rebound.  Musculoskeletal: Normal range of motion.        General: No tenderness.     Right lower leg: No edema.     Left lower leg: No edema.  Skin:    Findings: No erythema or rash.  Neurological:     Mental Status: She is alert and oriented to person, place, and time.     Cranial Nerves: No cranial nerve deficit.  Psychiatric:        Mood and Affect: Mood normal.    LABORATORY PANEL:  Female CBC Recent Labs  Lab 11/04/18 0424  WBC 8.9  HGB 9.1*  HCT 29.7*  PLT 169   ------------------------------------------------------------------------------------------------------------------ Chemistries  Recent Labs  Lab 11/03/18 0040 11/04/18 0424  NA 140 140  K 4.6 4.5  CL 114* 112*  CO2 16* 17*  GLUCOSE 250* 155*  BUN 68* 76*  CREATININE 4.91* 5.37*  CALCIUM 8.8* 8.4*  MG  --  2.1  AST 53*  --   ALT 33  --   ALKPHOS 123  --   BILITOT 0.5  --    RADIOLOGY:  No results found. ASSESSMENT AND PLAN:   This is an 84 year old female admitted for CHF exacerbation. 1.    Acute respiratory failure due to pulmonary edema secondary to acute on chronic systolic CHF: EF 52-71%.  She was intubated and extubated.  Off oxygen by nasal cannula. Continue torsemide, NEB PRN.  2.  Hypertension: Controlled. Continue torsemide, Coreg, Imdur and Norvasc.  4.  CAD: Stable; continue Imdur 5.  Diabetes mellitus type 2: Sliding scale insulin while hospitalized. Hemoglobin A1c 8.0.  6.  Hypothyroidism: continue Synthroid 7.  CKD: ESRD not on dialysis.  Continue Renvela.  Strict I's and O's. Patient refused  hemodialysis.  All the records are reviewed and case discussed with Care Management/Social Worker. Management plans discussed with the patient, daughters and they are in agreement.  CODE STATUS: Full Code  TOTAL TIME TAKING CARE OF THIS PATIENT: 28 minutes.   More than 50% of the time was spent in counseling/coordination of care: YES  POSSIBLE D/C IN 2 DAYS, DEPENDING ON CLINICAL CONDITION.   Demetrios Loll M.D on 11/04/2018 at 1:48 PM  Between 7am to 6pm - Pager - [phone]  After 6pm go to [email or web address] - [facility]

## 2018-11-05 LAB — GLUCOSE, CAPILLARY
Glucose-Capillary: 216 mg/dL — ABNORMAL HIGH (ref 70–99)
Glucose-Capillary: 123 mg/dL — ABNORMAL HIGH (ref 70–99)

## 2018-11-05 MED ORDER — TORSEMIDE 20 MG PO TABS: 20.0000 mg | ORAL_TABLET | Freq: Every day | ORAL | 1 refills | Status: AC

## 2018-11-05 MED ORDER — GUAIFENESIN-DM 100-10 MG/5ML PO SYRP: 5.0000 mL | ORAL_SOLUTION | ORAL | 0 refills | Status: AC | PRN

## 2018-11-05 MED ORDER — TORSEMIDE 20 MG PO TABS: 20.0000 mg | ORAL_TABLET | Freq: Every day | ORAL | 1 refills | Status: DC

## 2018-11-05 MED ORDER — ALBUTEROL SULFATE HFA 108 (90 BASE) MCG/ACT IN AERS: 2.0000 | INHALATION_SPRAY | Freq: Four times a day (QID) | RESPIRATORY_TRACT | 2 refills | Status: DC | PRN

## 2018-11-05 MED ORDER — ALBUTEROL SULFATE HFA 108 (90 BASE) MCG/ACT IN AERS: 2.0000 | INHALATION_SPRAY | Freq: Four times a day (QID) | RESPIRATORY_TRACT | 2 refills | Status: AC | PRN

## 2018-11-05 MED ORDER — TORSEMIDE 20 MG PO TABS: 20.0000 mg | ORAL_TABLET | Freq: Every day | ORAL | Status: DC

## 2018-11-05 MED ORDER — GUAIFENESIN-DM 100-10 MG/5ML PO SYRP: 5.0000 mL | ORAL_SOLUTION | ORAL | 0 refills | Status: DC | PRN

## 2018-11-05 NOTE — Progress Notes (Signed)
Ambulated Pt 50ft without O2. sats 96-97%

## 2018-11-05 NOTE — Care Management (Signed)
Called Patient to clarify that [facility] was trying to reach them to set up [facility],  Husband answered the phone and said that they figured out she is open with Hospice.  He does not know the agency but took my number and will have hospice call me when they come out to verify the information

## 2018-11-05 NOTE — Care Management (Signed)
Notified Joelene Millin with [facility] that patient wishes to continue using them for [facility] services and PT.  Joelene Millin stated that they do not currently have the patient as in service with them,  She would look up the patient and call back as to if they can take them or not

## 2018-11-05 NOTE — Discharge Summary (Signed)
[facility] at [place] NAME: Jacqueline Ramos    MR#:  [id number]  DATE OF BIRTH:  Mar 22, 1935  DATE OF ADMISSION:  11/03/2018   ADMITTING PHYSICIAN: Harrie Foreman, MD  DATE OF DISCHARGE: 11/05/2018 12:49 PM  PRIMARY CARE PHYSICIAN: [facility] [place] DIAGNOSIS:  Breathing difficulty DISCHARGE DIAGNOSIS:  Active Problems:   Acute respiratory failure with hypoxemia ([place])  SECONDARY DIAGNOSIS:   Past Medical History:  Diagnosis Date  . CHF (congestive heart failure) ([place])   . Coronary artery disease    CAD s/p CABG and pacemaker placement  . End stage renal disease ([place])    Not on dialysis  . Hyperlipidemia associated with type 2 diabetes mellitus ([place])   . Hypertension   . Paroxysmal atrial fibrillation ([facility])   . Type 2 diabetes mellitus with diabetic nephropathy [facility])    HOSPITAL COURSE:  This is an 84 year old female admitted for CHF exacerbation. 1.   Acute respiratory failure due to pulmonary edema secondary to acute on chronic systolic CHF: EF 37-10%.  She was intubated and extubated.  Off oxygen by nasal cannula. Continue torsemide 20 mg po daily, NEB PRN.  2. Hypertension: Controlled.Continue torsemide, Coreg, Imdur and Norvasc.  4. CAD: Stable; continue Imdur 5. Diabetes mellitus type 2: Sliding scale insulin while hospitalized. Hemoglobin A1c 8.0.  6. Hypothyroidism: continue Synthroid 7. CKD: ESRD not on dialysis. Continue Renvela.Strict I's and O's. Patient refused hemodialysis. Generalized weakness.  Home health and PT. DISCHARGE CONDITIONS:  Stable, discharge to home with home health PT today. CONSULTS OBTAINED:  Treatment Team:  [facility] Murlean Iba, MD DRUG ALLERGIES:  No Known Allergies DISCHARGE MEDICATIONS:   Allergies as of 11/05/2018   No Known Allergies     Medication List    TAKE these medications   albuterol 108 (90 Base)  MCG/ACT inhaler Commonly known as:  PROVENTIL HFA;VENTOLIN HFA Inhale 2 puffs into the lungs every 6 (six) hours as needed for wheezing or shortness of breath.   amLODipine 10 MG tablet Commonly known as:  NORVASC Take 10 mg by mouth daily.   carvedilol 12.5 MG tablet Commonly known as:  COREG Take 25 mg by mouth 2 (two) times daily with a meal.   guaiFENesin-dextromethorphan 100-10 MG/5ML syrup Commonly known as:  ROBITUSSIN DM Take 5 mLs by mouth every 4 (four) hours as needed for cough.   hydrALAZINE 50 MG tablet Commonly known as:  APRESOLINE Take 50 mg by mouth 3 (three) times daily.   insulin aspart 100 UNIT/ML injection Commonly known as:  novoLOG Inject 8 Units into the skin 3 (three) times daily before meals.   isosorbide mononitrate 60 MG 24 hr tablet Commonly known as:  IMDUR Take 60 mg by mouth daily.   levothyroxine 112 MCG tablet Commonly known as:  SYNTHROID, LEVOTHROID Take 112 mcg by mouth daily before breakfast.   lovastatin 40 MG tablet Commonly known as:  MEVACOR Take 40 mg by mouth at bedtime.   nitroGLYCERIN 0.4 MG SL tablet Commonly known as:  NITROSTAT Place 0.4 mg under the tongue every 5 (five) minutes as needed for chest pain.   sevelamer carbonate 800 MG tablet Commonly known as:  RENVELA Take 800 mg by mouth 3 (three) times daily with meals.   torsemide 20 MG tablet Commonly known as:  DEMADEX Take 1 tablet (20 mg total) by mouth daily. What changed:    medication strength  how much to  take  additional instructions   vitamin B-12 1000 MCG tablet Commonly known as:  CYANOCOBALAMIN Take 1,000 mcg by mouth daily.            Durable Medical Equipment  (From admission, onward)         Start     Ordered   11/05/18 1129  For home use only DME Bedside commode  Once    Question:  Patient needs a bedside commode to treat with the following condition  Answer:  Weakness generalized   11/05/18 1128           DISCHARGE  INSTRUCTIONS:  See AVS.  If you experience worsening of your admission symptoms, develop shortness of breath, life threatening emergency, suicidal or homicidal thoughts you must seek medical attention immediately by calling 911 or calling your MD immediately  if symptoms less severe.  You Must read complete instructions/literature along with all the possible adverse reactions/side effects for all the Medicines you take and that have been prescribed to you. Take any new Medicines after you have completely understood and accpet all the possible adverse reactions/side effects.   Please note  You were cared for by a hospitalist during your hospital stay. If you have any questions about your discharge medications or the care you received while you were in the hospital after you are discharged, you can call the unit and asked to speak with the hospitalist on call if the hospitalist that took care of you is not available. Once you are discharged, your primary care physician will handle any further medical issues. Please note that NO REFILLS for any discharge medications will be authorized once you are discharged, as it is imperative that you return to your primary care physician (or establish a relationship with a primary care physician if you do not have one) for your aftercare needs so that they can reassess your need for medications and monitor your lab values.    On the day of Discharge:  VITAL SIGNS:  Blood pressure 135/64, pulse 69, temperature 99.1 F (37.3 C), temperature source Oral, resp. rate (!) 23, height 4\' 6"  (1.372 m), weight 49 kg, SpO2 100 %. PHYSICAL EXAMINATION:  GENERAL:  84 y.o.-year-old patient lying in the bed with no acute distress.  EYES: Pupils equal, round, reactive to light and accommodation. No scleral icterus. Extraocular muscles intact.  HEENT: Head atraumatic, normocephalic. Oropharynx and nasopharynx clear.  NECK:  Supple, no jugular venous distention. No thyroid  enlargement, no tenderness.  LUNGS: Normal breath sounds bilaterally, no wheezing, rales,rhonchi or crepitation. No use of accessory muscles of respiration.  CARDIOVASCULAR: S1, S2 normal. No murmurs, rubs, or gallops.  ABDOMEN: Soft, non-tender, non-distended. Bowel sounds present. No organomegaly or mass.  EXTREMITIES: No pedal edema, cyanosis, or clubbing.  NEUROLOGIC: Cranial nerves II through XII are intact. Muscle strength 4/5 in all extremities. Sensation intact. Gait not checked.  PSYCHIATRIC: The patient is alert and oriented x 3.  SKIN: No obvious rash, lesion, or ulcer.  DATA REVIEW:   CBC Recent Labs  Lab 11/04/18 0424  WBC 8.9  HGB 9.1*  HCT 29.7*  PLT 169    Chemistries  Recent Labs  Lab 11/03/18 0040 11/04/18 0424  NA 140 140  K 4.6 4.5  CL 114* 112*  CO2 16* 17*  GLUCOSE 250* 155*  BUN 68* 76*  CREATININE 4.91* 5.37*  CALCIUM 8.8* 8.4*  MG  --  2.1  AST 53*  --   ALT 33  --  ALKPHOS 123  --   BILITOT 0.5  --      Microbiology Results  Results for orders placed or performed during the hospital encounter of 11/03/18  MRSA PCR Screening     Status: None   Collection Time: 11/03/18  4:03 AM  Result Value Ref Range Status   MRSA by PCR NEGATIVE NEGATIVE Final    Comment:        The GeneXpert MRSA Assay (FDA approved for NASAL specimens only), is one component of a comprehensive MRSA colonization surveillance program. It is not intended to diagnose MRSA infection nor to guide or monitor treatment for MRSA infections. Performed at [facility], [address] [place], [place] [address]     RADIOLOGY:  No results found.   Management plans discussed with the patient, her daughter and they are in agreement.  CODE STATUS: Full Code   TOTAL TIME TAKING CARE OF THIS PATIENT: 35 minutes.    Demetrios Loll M.D on 11/05/2018 at 2:31 PM  Between 7am to 6pm - Pager - [phone]  After 6pm go to [email or web address] - Clinical research associate [facility]  Office  [phone]  CC: Primary care physician; [employer]   Note: This dictation was prepared with Diplomatic Services operational officer dictation along with smaller Company secretary. Any transcriptional errors that result from this process are unintentional.

## 2018-11-05 NOTE — Care Management (Signed)
Joelene Millin from [facility] stated they do not accept the insurance. Corene Cornea from [facility] has agreed to accept this patient for [facility] Bedside commode in the room, family notified that [facility] has agreed to accept

## 2018-11-05 NOTE — Care Management Note (Signed)
Case Management Note  Patient Details  Name: Jacqueline Ramos MRN: [id number] Date of Birth: May 16, 1935  Subjective/Objective:                  Met with Patient and husband to discuss discharge plan Patient states that she currently has home health in place and would like to continue with that company, she is not sure what the company is, Patient has a rolling walker at home and needs a 3 in 1 bed side commode Patient is to [place] home today Patient uses [facility] mail order, patient can afford medications Patient has Dr. Wanda Plump from [facility] [place] [facility] Patient has husband and daughter provide transportation    Action/Plan: [facility] list provided per CMS.gov to the patient, notified jason with [facility] about need for 3 in 1 Notify  [facility] agency to determine who the patient has already and notify that she wishes to continue Expected Discharge Date:  11/05/18               Expected Discharge Plan:  [place]  In-House Referral:     Discharge planning Services  CM Consult  Post Acute Care Choice:  Durable Medical Equipment Choice offered to:  Patient  DME Arranged:  3-N-1 DME Agency:  [place] Arranged:  PT, RN [facility] Agency:     Status of Service:  In process, will continue to follow  If discussed at Long Length of Stay Meetings, dates discussed:    Additional Comments:  Su Hilt, RN 11/05/2018, 9:07 AM

## 2019-04-24 DEATH — deceased

## 2019-08-09 IMAGING — DX DG CHEST 1V PORT
1 series · 1 of 1 positions shown · non-contrast
Comparison: None.

CLINICAL DATA: 83 y/o  F; respiratory distress.

EXAM:
PORTABLE CHEST 1 VIEW

[chest ap]
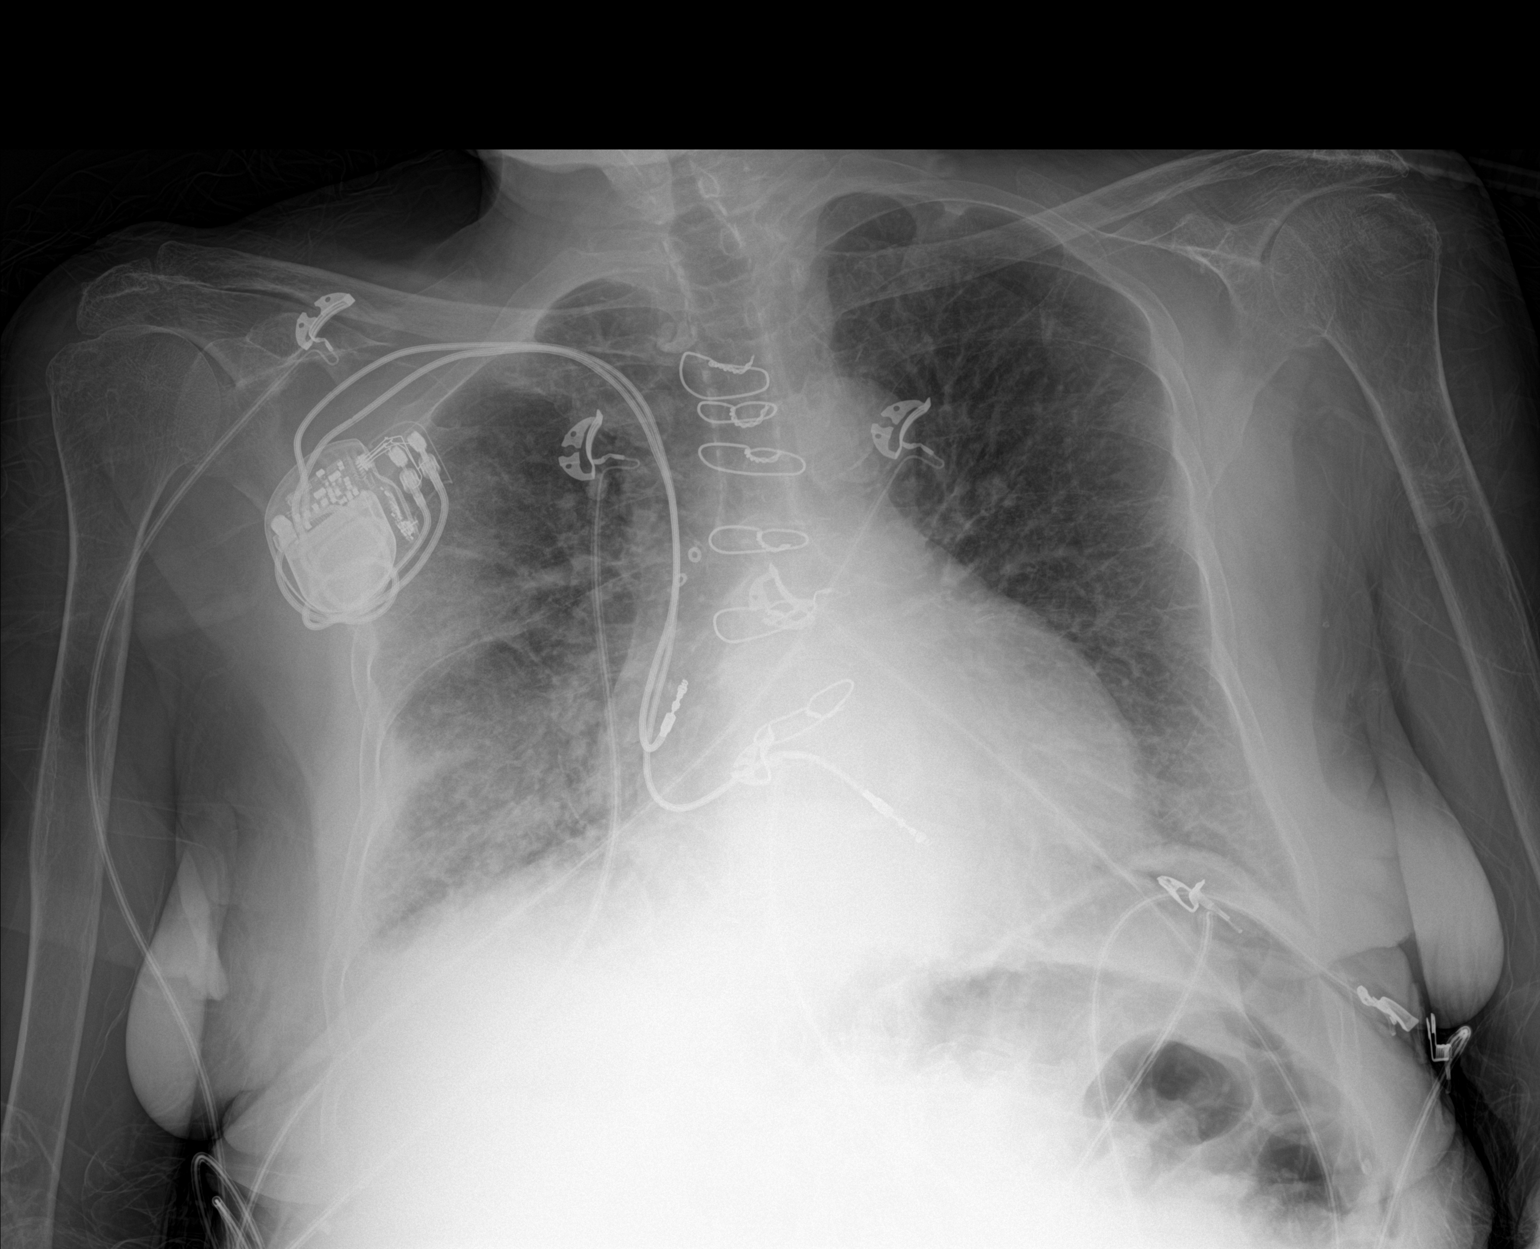

[1 of 1 positions shown; findings below may reference images not displayed]

FINDINGS: Stable cardiomegaly given projection and technique. Aortic calcific
atherosclerosis. Post median sternotomy with wires in alignment. Two
lead pacemaker. Reticular and peripheral linear opacities of the
lungs. Small right effusion. No acute osseous abnormality is
evident.
IMPRESSION: 1. Interstitial pulmonary edema.  Small right effusion.
2. Cardiomegaly.
3.  Aortic Atherosclerosis ([id number]).
